# Patient Record
Sex: Female | Born: 1990 | Race: Black or African American | Hispanic: No | Marital: Single | Smoking: Never smoker
Health system: Southern US, Community
[De-identification: ages and names within clinical notes are randomized; demographics above are authoritative.]

## PROBLEM LIST (undated history)

## (undated) ENCOUNTER — Emergency Department (HOSPITAL_COMMUNITY): Payer: Medicare Other | Source: Home / Self Care

## (undated) DIAGNOSIS — D571 Sickle-cell disease without crisis: Secondary | ICD-10-CM

## (undated) DIAGNOSIS — D649 Anemia, unspecified: Secondary | ICD-10-CM

## (undated) DIAGNOSIS — D5701 Hb-SS disease with acute chest syndrome: Secondary | ICD-10-CM

## (undated) DIAGNOSIS — Z720 Tobacco use: Secondary | ICD-10-CM

---

## 2016-05-27 ENCOUNTER — Encounter (HOSPITAL_COMMUNITY): Payer: Self-pay | Admitting: Internal Medicine

## 2016-05-27 ENCOUNTER — Inpatient Hospital Stay (HOSPITAL_COMMUNITY)
Admission: EM | Admit: 2016-05-27 | Discharge: 2016-06-01 | DRG: 812 | Disposition: A | Discharge status: Home / Self Care | Payer: Medicare Other | Attending: Internal Medicine | Admitting: Internal Medicine

## 2016-05-27 DIAGNOSIS — D5700 Hb-SS disease with crisis, unspecified: Secondary | ICD-10-CM | POA: Diagnosis present

## 2016-05-27 DIAGNOSIS — D72829 Elevated white blood cell count, unspecified: Secondary | ICD-10-CM | POA: Diagnosis not present

## 2016-05-27 DIAGNOSIS — D638 Anemia in other chronic diseases classified elsewhere: Secondary | ICD-10-CM | POA: Diagnosis present

## 2016-05-27 DIAGNOSIS — R0902 Hypoxemia: Secondary | ICD-10-CM

## 2016-05-27 DIAGNOSIS — Z79899 Other long term (current) drug therapy: Secondary | ICD-10-CM

## 2016-05-27 DIAGNOSIS — Z590 Homelessness: Secondary | ICD-10-CM

## 2016-05-27 DIAGNOSIS — R0600 Dyspnea, unspecified: Secondary | ICD-10-CM | POA: Diagnosis not present

## 2016-05-27 DIAGNOSIS — D571 Sickle-cell disease without crisis: Secondary | ICD-10-CM | POA: Diagnosis present

## 2016-05-27 HISTORY — DX: Sickle-cell disease without crisis: D57.1

## 2016-05-27 HISTORY — DX: Anemia, unspecified: D64.9

## 2016-05-27 LAB — CBC WITH DIFFERENTIAL/PLATELET
Basophils Absolute: 0.1 10*3/uL (ref 0.0–0.1)
Basophils Relative: 1 %
EOS PCT: 6 %
Eosinophils Absolute: 0.7 10*3/uL (ref 0.0–0.7)
HEMATOCRIT: 25.6 % — AB (ref 36.0–46.0)
Hemoglobin: 8.6 g/dL — ABNORMAL LOW (ref 12.0–15.0)
LYMPHS PCT: 36 %
Lymphs Abs: 4.1 10*3/uL — ABNORMAL HIGH (ref 0.7–4.0)
MCH: 27.8 pg (ref 26.0–34.0)
MCHC: 33.6 g/dL (ref 30.0–36.0)
MCV: 82.8 fL (ref 78.0–100.0)
MONO ABS: 1 10*3/uL (ref 0.1–1.0)
Monocytes Relative: 9 %
Neutro Abs: 5.6 10*3/uL (ref 1.7–7.7)
Neutrophils Relative %: 48 %
PLATELETS: 436 10*3/uL — AB (ref 150–400)
RBC: 3.09 MIL/uL — ABNORMAL LOW (ref 3.87–5.11)
RDW: 19.5 % — AB (ref 11.5–15.5)
WBC: 11.4 10*3/uL — AB (ref 4.0–10.5)

## 2016-05-27 LAB — COMPREHENSIVE METABOLIC PANEL
ALT: 19 U/L (ref 14–54)
AST: 38 U/L (ref 15–41)
Albumin: 4.9 g/dL (ref 3.5–5.0)
Alkaline Phosphatase: 41 U/L (ref 38–126)
Anion gap: 9 (ref 5–15)
BILIRUBIN TOTAL: 2.7 mg/dL — AB (ref 0.3–1.2)
BUN: 9 mg/dL (ref 6–20)
CALCIUM: 9.1 mg/dL (ref 8.9–10.3)
CO2: 20 mmol/L — ABNORMAL LOW (ref 22–32)
Chloride: 109 mmol/L (ref 101–111)
Creatinine, Ser: 0.52 mg/dL (ref 0.44–1.00)
GFR calc Af Amer: >60 mL/min (ref >60)
GFR calc non Af Amer: >60 mL/min (ref >60)
Glucose, Bld: 99 mg/dL (ref 65–99)
POTASSIUM: 3.7 mmol/L (ref 3.5–5.1)
Sodium: 138 mmol/L (ref 135–145)
TOTAL PROTEIN: 8 g/dL (ref 6.5–8.1)

## 2016-05-27 LAB — RETICULOCYTES
RBC.: 3.09 MIL/uL — ABNORMAL LOW (ref 3.87–5.11)
RETIC COUNT ABSOLUTE: 438.8 10*3/uL — AB (ref 19.0–186.0)
Retic Ct Pct: 14.2 % — ABNORMAL HIGH (ref 0.4–3.1)

## 2016-05-27 MED ORDER — HYDROMORPHONE HCL 1 MG/ML IJ SOLN
0.5000 mg | INTRAMUSCULAR | Status: AC
Start: 2016-05-27 — End: 2016-05-27

## 2016-05-27 MED ORDER — ONDANSETRON HCL 4 MG/2ML IJ SOLN
4.0000 mg | Freq: Once | INTRAMUSCULAR | Status: AC
  Administered 2016-05-27: 4 mg via INTRAVENOUS
  Filled 2016-05-27: qty 2

## 2016-05-27 MED ORDER — HYDROMORPHONE HCL 1 MG/ML IJ SOLN: 0.5000 mg | INTRAMUSCULAR | Status: AC

## 2016-05-27 MED ORDER — HYDROMORPHONE HCL 1 MG/ML IJ SOLN
0.5000 mg | INTRAMUSCULAR | Status: AC
  Administered 2016-05-27: 1 mg via INTRAVENOUS
  Filled 2016-05-27: qty 1

## 2016-05-27 MED ORDER — DIPHENHYDRAMINE HCL 50 MG/ML IJ SOLN
25.0000 mg | Freq: Once | INTRAMUSCULAR | Status: AC
  Administered 2016-05-27: 25 mg via INTRAVENOUS
  Filled 2016-05-27: qty 1

## 2016-05-27 MED ORDER — HYDROMORPHONE HCL 1 MG/ML IJ SOLN
0.5000 mg | INTRAMUSCULAR | Status: AC
Start: 2016-05-27 — End: 2016-05-27
  Administered 2016-05-27: 1 mg via INTRAVENOUS
  Filled 2016-05-27: qty 1

## 2016-05-27 MED ORDER — KETOROLAC TROMETHAMINE 15 MG/ML IJ SOLN
15.0000 mg | INTRAMUSCULAR | Status: AC
  Administered 2016-05-27: 15 mg via INTRAVENOUS
  Filled 2016-05-27: qty 1

## 2016-05-27 MED ORDER — SODIUM CHLORIDE 0.45 % IV SOLN
INTRAVENOUS | Status: DC
  Administered 2016-05-27: 22:00:00 via INTRAVENOUS

## 2016-05-27 NOTE — ED Triage Notes (Signed)
Per EMS-complaining of sickle cell pain-while waiting for registration patient was smiling and conversing with BF, appeared to become suddenly weak-stated their ride was taking to long to come and get them so they called EMS-complaining of B/L leg pain-recently moved from [place] to "start a new life with BF"-does not appear to be in any distress-BP 104/67, HR 99, SpO2 99% on RA-ambulatory at scene and walked into ED

## 2016-05-27 NOTE — H&P (Addendum)
History and Physical    Wendy [name] WUJ:[id number] DOB: [birth year] DOA: 05/27/2016  Referring MD/NP/PA:   PCP: No PCP Per Patient   Patient coming from:  The patient is coming from home.  At baseline, pt is independent for most of ADL.   Chief Complaint: Pain all over  HPI: Wendy [name] is a 26 y.o. female with medical history significant of sickle cell disease, who presents with pain all over.  Patient states that her pain started since last night. It involves all over her body, but worst in bilateral upper legs. The leg pain is constant, 10 out of 10 in severity, nonradiating. Patient states that she has nausea, vomited once after she received Dilaudid in ED. Currently no nausea, vomiting, diarrhea or abdominal pain. Denies fever, chills, chest pain, SOB, cough, symptoms of UTI or unilateral weakness.  Patient states that she recently moved to this area for new life with her boyfriend. When asked her where she usually got her care, she said in [place] to [facility], but told me that she was treated in [place], then said [place]. When asked the name of the hospital there, she did not know. She states that she usually takes Percocet, but ran out of her pain medication last week. She reported to EDP that her last crisis was "a long time ago", but told me that her last pain crisis was last week.   ED Course: pt was found to have hemoglobin 8.6 (not know baseline hemoglobin), WBC 11.6, electrolytes renal function okay, temperature normal, tachycardia, section 93% on room air. Patient is placed on tele bed.  Review of Systems:   General: no fevers, chills, no changes in body weight, has fatigue HEENT: no blurry vision, hearing changes or sore throat Respiratory: no dyspnea, coughing, wheezing CV: no chest pain, no palpitations GI: no nausea, vomiting, abdominal pain, diarrhea, constipation GU: no dysuria, burning on urination, increased urinary frequency, hematuria  Ext: no leg  edema Neuro: no unilateral weakness, numbness, or tingling, no vision change or hearing loss Skin: no rash, no skin tear. MSK: Pain all over, particularly in the bilateral upper leg.  Heme: No easy bruising.  Travel history: No recent long distant travel.  Allergy: No Known Allergies  Past Medical History:  Diagnosis Date  . Sickle cell anemia (HCC)     Past Surgical History:  Procedure Laterality Date  . CESAREAN SECTION      Social History:  reports that she has never smoked. She does not have any smokeless tobacco history on file. She reports that she does not drink alcohol or use drugs.  Family History:  Family History  Problem Relation Age of Onset  . Sickle cell trait Father      Prior to Admission medications   Medication Sig Start Date End Date Taking? Authorizing Provider  acetaminophen (TYLENOL) 500 MG tablet Take 500 mg by mouth every 6 (six) hours as needed for moderate pain.   Yes Historical Provider, MD    Physical Exam: Vitals:   05/27/16 2255 05/28/16 0039 05/28/16 0056 05/28/16 0127  BP: 135/71 (!) 119/52    Pulse: (!) 113 92    Resp: 12 16 16 16   Temp: 97.8 F (36.6 C) 97.6 F (36.4 C)    TempSrc: Oral Oral    SpO2: 93% 90% 94% 99%  Weight:      Height:       General: Not in acute distress HEENT:       Eyes: PERRL, EOMI, no scleral  icterus.       ENT: No discharge from the ears and nose, no pharynx injection, no tonsillar enlargement.        Neck: No JVD, no bruit, no mass felt. Heme: No neck lymph node enlargement. Cardiac: S1/S2, RRR, No murmurs, No gallops or rubs. Respiratory:  No rales, wheezing, rhonchi or rubs. GI: Soft, nondistended, nontender, no rebound pain, no organomegaly, BS present. GU: No hematuria Ext: No pitting leg edema bilaterally. 2+DP/PT pulse bilaterally. Musculoskeletal: Has tenderness in both legs.  Skin: No rashes.  Neuro: Alert, oriented X3, cranial nerves II-XII grossly intact, moves all extremities normally.    Psych: Patient is not psychotic, no suicidal or hemocidal ideation.  Labs on Admission: I have personally reviewed following labs and imaging studies  CBC:  Recent Labs Lab 05/27/16 1948  WBC 11.4*  NEUTROABS 5.6  HGB 8.6*  HCT 25.6*  MCV 82.8  PLT 073*   Basic Metabolic Panel:  Recent Labs Lab 05/27/16 1948  NA 138  K 3.7  CL 109  CO2 20*  GLUCOSE 99  BUN 9  CREATININE 0.52  CALCIUM 9.1   GFR: Estimated Creatinine Clearance: 88.2 mL/min (by C-G formula based on SCr of 0.52 mg/dL). Liver Function Tests:  Recent Labs Lab 05/27/16 1948  AST 38  ALT 19  ALKPHOS 41  BILITOT 2.7*  PROT 8.0  ALBUMIN 4.9   No results for input(s): LIPASE, AMYLASE in the last 168 hours. No results for input(s): AMMONIA in the last 168 hours. Coagulation Profile: No results for input(s): INR, PROTIME in the last 168 hours. Cardiac Enzymes: No results for input(s): CKTOTAL, CKMB, CKMBINDEX, TROPONINI in the last 168 hours. BNP (last 3 results) No results for input(s): PROBNP in the last 8760 hours. HbA1C: No results for input(s): HGBA1C in the last 72 hours. CBG: No results for input(s): GLUCAP in the last 168 hours. Lipid Profile: No results for input(s): CHOL, HDL, LDLCALC, TRIG, CHOLHDL, LDLDIRECT in the last 72 hours. Thyroid Function Tests: No results for input(s): TSH, T4TOTAL, FREET4, T3FREE, THYROIDAB in the last 72 hours. Anemia Panel:  Recent Labs  05/27/16 1948  RETICCTPCT 14.2*   Urine analysis: No results found for: COLORURINE, APPEARANCEUR, LABSPEC, PHURINE, GLUCOSEU, HGBUR, BILIRUBINUR, KETONESUR, PROTEINUR, UROBILINOGEN, NITRITE, LEUKOCYTESUR Sepsis Labs: @LABRCNTIP (procalcitonin:4,lacticidven:4) )No results found for this or any previous visit (from the past 240 hour(s)).   Radiological Exams on Admission: No results found.   EKG: Not done in ED, will get one. Sinus rhythm, QTC 440, early R-wave progression, nonspecific T-wave  change.  Assessment/Plan Principal Problem:   Sickle cell pain crisis (HCC) Active Problems:   Sickle cell anemia (HCC)   Leukocytosis   Sickle cell pain crisis and Sickle cell anemia:  Hgb 8.0, not sure about her baseline hemoglobin, but anyway, does not need blood transfusion now. No signs of infection. No chest pain or shortness of breath, less likely to have acute chestsyndrome.  -Place on tele bed given tachycardia. -IV ketorolac -Start folic acid -Benadryl for itch -IVF: D5-1/2NS at 150 cc/h -Zofran for nausea -Start high dose PCA protocol for pain: loading dose 1 mg, bolus dose 0.5 mg, lockout interval 10 min and one hour dose limit at 2 mg.   Leukocytosis: no signs of infection. Likely due to stress induced to demargination. -follow up by CBC -check UA.   DVT ppx: SQ Lovenox Code Status: Full code Family Communication: Yes, patient's boyfriend at bed side Disposition Plan:  Anticipate discharge back to previous home environment  Consults called:  none Admission status: Obs / tele     Date of Service 05/28/2016    Ivor Costa [facility] Pager [phone]  If 7PM-7AM, please contact night-coverage [email or web address] Password [email or web address] 05/28/2016, 2:13 AM

## 2016-05-27 NOTE — ED Provider Notes (Signed)
[facility] DEPT Provider Note   CSN: [id number] Arrival date & time: 05/27/16  1801     History   Chief Complaint Chief Complaint  Patient presents with  . Generalized Body Aches  . Sickle Cell Pain Crisis    HPI Wendy [name] is a 26 y.o. female.  HPI   Patient is a 26 year old female presenting with sickle cell crisis pain. Patient has odd affect. Patient reports she's [place]. When asked her where she usually got her care she said in [place]. Asked which hospital she said [facility] Then she said no, it was [facility] that she goes to. When asked the name of the hospital there, she did not know. She says that she's not on any medications. She says that her last crisis was "a long time ago". She denies chest pain. She reports that she moved here yesterday.  No past medical history on file.  There are no active problems to display for this patient.   No past surgical history on file.  OB History    No data available       Home Medications    Prior to Admission medications   Medication Sig Start Date End Date Taking? Authorizing Provider  acetaminophen (TYLENOL) 500 MG tablet Take 500 mg by mouth every 6 (six) hours as needed for moderate pain.   Yes Historical Provider, MD    Family History No family history on file.  Social History Social History  Substance Use Topics  . Smoking status: Not on file  . Smokeless tobacco: Not on file  . Alcohol use Not on file     Allergies   Patient has no known allergies.   Review of Systems Review of Systems  Constitutional: Negative for activity change.  Respiratory: Negative for shortness of breath.   Cardiovascular: Negative for chest pain.  Gastrointestinal: Negative for abdominal pain.     Physical Exam Updated Vital Signs BP 102/62 (BP Location: Left Arm)   Pulse 77   Temp 98.2 F (36.8 C) (Oral)   Resp 12   Ht 5\' 3"  (1.6 m)   Wt 132 lb 8 oz (60.1 kg)   SpO2 97%   BMI 23.47 kg/m    Physical Exam  Constitutional: She is oriented to person, place, and time. She appears well-developed and well-nourished.  HENT:  Head: Normocephalic and atraumatic.  Eyes: Right eye exhibits no discharge.  Cardiovascular: Normal rate, regular rhythm and normal heart sounds.   No murmur heard. Pulmonary/Chest: Effort normal and breath sounds normal. She has no wheezes. She has no rales.  Abdominal: Soft. She exhibits no distension. There is no tenderness.  Neurological: She is oriented to person, place, and time.  Skin: Skin is warm and dry. She is not diaphoretic.  Psychiatric: She has a normal mood and affect.  Nursing note and vitals reviewed.    ED Treatments / Results  Labs (all labs ordered are listed, but only abnormal results are displayed) Labs Reviewed  COMPREHENSIVE METABOLIC PANEL - Abnormal; Notable for the following:       Result Value   CO2 20 (*)    Total Bilirubin 2.7 (*)    All other components within normal limits  CBC WITH DIFFERENTIAL/PLATELET - Abnormal; Notable for the following:    WBC 11.4 (*)    RBC 3.09 (*)    Hemoglobin 8.6 (*)    HCT 25.6 (*)    RDW 19.5 (*)    Platelets 436 (*)    Lymphs Abs  4.1 (*)    All other components within normal limits  RETICULOCYTES - Abnormal; Notable for the following:    Retic Ct Pct 14.2 (*)    RBC. 3.09 (*)    Retic Count, Manual 438.8 (*)    All other components within normal limits    EKG  EKG Interpretation None       Radiology No results found.  Procedures Procedures (including critical care time)  Medications Ordered in ED Medications  0.45 % sodium chloride infusion (not administered)  ketorolac (TORADOL) 15 MG/ML injection 15 mg (not administered)  HYDROmorphone (DILAUDID) injection 0.5-1 mg (not administered)    Or  HYDROmorphone (DILAUDID) injection 0.5-1 mg (not administered)  HYDROmorphone (DILAUDID) injection 0.5-1 mg (not administered)    Or  HYDROmorphone (DILAUDID) injection  0.5-1 mg (not administered)  HYDROmorphone (DILAUDID) injection 0.5-1 mg (not administered)    Or  HYDROmorphone (DILAUDID) injection 0.5-1 mg (not administered)     Initial Impression / Assessment and Plan / ED Course  I have reviewed the triage vital signs and the nursing notes.  Pertinent labs & imaging results that were available during my care of the patient were reviewed by me and considered in my medical decision making (see chart for details).     Very well-appearing 26 year old female presenting with sickle cell crisis. Patient is very odd affect. Is difficult to tell what is going on. She reports that she has sickle cell [place]. Patient does have a higher reti count.Burnis Medin treat her like a sickle cell crisis however it is very common to have a sickle cell crisis and [place]. There are no records available on care everyone.  11:36 PM We've given 3 pain medication doses. Patient still in pain, vomiting. Will admit to hospitalist for pain crisis.  Final Clinical Impressions(s) / ED Diagnoses   Final diagnoses:  None    New Prescriptions New Prescriptions   No medications on file     Rozalynn Buege Julio Alm, MD 05/27/16 2336

## 2016-05-28 ENCOUNTER — Encounter (HOSPITAL_COMMUNITY): Payer: Self-pay | Admitting: Internal Medicine

## 2016-05-28 DIAGNOSIS — Z79899 Other long term (current) drug therapy: Secondary | ICD-10-CM | POA: Diagnosis not present

## 2016-05-28 DIAGNOSIS — D72829 Elevated white blood cell count, unspecified: Secondary | ICD-10-CM | POA: Diagnosis present

## 2016-05-28 DIAGNOSIS — D638 Anemia in other chronic diseases classified elsewhere: Secondary | ICD-10-CM

## 2016-05-28 DIAGNOSIS — D5700 Hb-SS disease with crisis, unspecified: Principal | ICD-10-CM

## 2016-05-28 DIAGNOSIS — R0902 Hypoxemia: Secondary | ICD-10-CM | POA: Diagnosis present

## 2016-05-28 DIAGNOSIS — R0600 Dyspnea, unspecified: Secondary | ICD-10-CM | POA: Diagnosis not present

## 2016-05-28 DIAGNOSIS — R0609 Other forms of dyspnea: Secondary | ICD-10-CM | POA: Diagnosis not present

## 2016-05-28 DIAGNOSIS — D5701 Hb-SS disease with acute chest syndrome: Secondary | ICD-10-CM | POA: Diagnosis not present

## 2016-05-28 DIAGNOSIS — Z590 Homelessness: Secondary | ICD-10-CM | POA: Diagnosis not present

## 2016-05-28 LAB — URINALYSIS, ROUTINE W REFLEX MICROSCOPIC
Bilirubin Urine: NEGATIVE
GLUCOSE, UA: NEGATIVE mg/dL
HGB URINE DIPSTICK: NEGATIVE
Ketones, ur: NEGATIVE mg/dL
NITRITE: NEGATIVE
Protein, ur: 30 mg/dL — AB
SPECIFIC GRAVITY, URINE: 1.011 (ref 1.005–1.030)
pH: 6 (ref 5.0–8.0)

## 2016-05-28 LAB — HIV ANTIBODY (ROUTINE TESTING W REFLEX): HIV Screen 4th Generation wRfx: NONREACTIVE

## 2016-05-28 LAB — HCG, QUANTITATIVE, PREGNANCY: hCG, Beta Chain, Quant, S: <1 m[IU]/mL (ref <5)

## 2016-05-28 MED ORDER — DEXTROSE-NACL 5-0.45 % IV SOLN
INTRAVENOUS | Status: DC
  Administered 2016-05-28 – 2016-05-29 (×3): via INTRAVENOUS

## 2016-05-28 MED ORDER — POLYETHYLENE GLYCOL 3350 17 G PO PACK: 17.0000 g | PACK | Freq: Every day | ORAL | Status: DC | PRN

## 2016-05-28 MED ORDER — NALOXONE HCL 0.4 MG/ML IJ SOLN: 0.4000 mg | INTRAMUSCULAR | Status: DC | PRN

## 2016-05-28 MED ORDER — SODIUM CHLORIDE 0.9% FLUSH: 9.0000 mL | INTRAVENOUS | Status: DC | PRN

## 2016-05-28 MED ORDER — KETOROLAC TROMETHAMINE 15 MG/ML IJ SOLN
15.0000 mg | Freq: Four times a day (QID) | INTRAMUSCULAR | Status: DC
  Administered 2016-05-28 – 2016-06-01 (×19): 15 mg via INTRAVENOUS
  Filled 2016-05-28 (×19): qty 1

## 2016-05-28 MED ORDER — FOLIC ACID 1 MG PO TABS
1.0000 mg | ORAL_TABLET | Freq: Every day | ORAL | Status: DC
  Administered 2016-05-28 – 2016-06-01 (×5): 1 mg via ORAL
  Filled 2016-05-28 (×5): qty 1

## 2016-05-28 MED ORDER — HYDROMORPHONE 1 MG/ML IV SOLN
INTRAVENOUS | Status: DC
  Administered 2016-05-29: 2.5 mg via INTRAVENOUS
  Administered 2016-05-29: 14:00:00 via INTRAVENOUS
  Administered 2016-05-29 – 2016-05-30 (×3): 2 mg via INTRAVENOUS
  Administered 2016-05-30: 4 mg via INTRAVENOUS
  Administered 2016-05-30: 3.9 mg via INTRAVENOUS
  Administered 2016-05-31: 2.5 mg via INTRAVENOUS
  Administered 2016-05-31: 1 mg via INTRAVENOUS
  Administered 2016-05-31: 0.05 mg via INTRAVENOUS
  Administered 2016-05-31 (×2): 1 mg via INTRAVENOUS
  Administered 2016-05-31: 21:00:00 via INTRAVENOUS
  Administered 2016-05-31: 0.5 mg via INTRAVENOUS
  Administered 2016-06-01: 1 mg via INTRAVENOUS
  Administered 2016-06-01: 1.5 mg via INTRAVENOUS
  Administered 2016-06-01: 0.5 mg via INTRAVENOUS
  Administered 2016-06-01: 2 mg via INTRAVENOUS
  Filled 2016-05-28 (×2): qty 25

## 2016-05-28 MED ORDER — HYDROMORPHONE HCL 4 MG/ML IJ SOLN
1.0000 mg | Freq: Once | INTRAMUSCULAR | Status: AC
  Administered 2016-05-28: 1 mg via INTRAVENOUS

## 2016-05-28 MED ORDER — ONDANSETRON HCL 4 MG/2ML IJ SOLN
4.0000 mg | Freq: Four times a day (QID) | INTRAMUSCULAR | Status: DC | PRN
  Administered 2016-05-29 – 2016-05-31 (×2): 4 mg via INTRAVENOUS
  Filled 2016-05-28 (×2): qty 2

## 2016-05-28 MED ORDER — SENNOSIDES-DOCUSATE SODIUM 8.6-50 MG PO TABS
1.0000 | ORAL_TABLET | Freq: Two times a day (BID) | ORAL | Status: DC
  Administered 2016-05-28 – 2016-06-01 (×8): 1 via ORAL
  Filled 2016-05-28 (×10): qty 1

## 2016-05-28 MED ORDER — ACETAMINOPHEN 325 MG PO TABS: 650.0000 mg | ORAL_TABLET | Freq: Four times a day (QID) | ORAL | Status: DC | PRN

## 2016-05-28 MED ORDER — DIPHENHYDRAMINE HCL 12.5 MG/5ML PO ELIX
12.5000 mg | ORAL_SOLUTION | Freq: Four times a day (QID) | ORAL | Status: DC | PRN
  Administered 2016-05-28: 12.5 mg via ORAL
  Filled 2016-05-28: qty 5

## 2016-05-28 MED ORDER — ENOXAPARIN SODIUM 40 MG/0.4ML ~~LOC~~ SOLN
40.0000 mg | Freq: Every day | SUBCUTANEOUS | Status: DC
  Filled 2016-05-28 (×2): qty 0.4

## 2016-05-28 MED ORDER — ZOLPIDEM TARTRATE 5 MG PO TABS: 5.0000 mg | ORAL_TABLET | Freq: Every evening | ORAL | Status: DC | PRN

## 2016-05-28 MED ORDER — DIPHENHYDRAMINE HCL 50 MG/ML IJ SOLN
12.5000 mg | Freq: Four times a day (QID) | INTRAMUSCULAR | Status: DC | PRN
  Administered 2016-05-28 (×2): 12.5 mg via INTRAVENOUS
  Filled 2016-05-28 (×2): qty 1

## 2016-05-28 MED ORDER — HYDROMORPHONE 1 MG/ML IV SOLN
INTRAVENOUS | Status: DC
  Administered 2016-05-28 (×2): 1.5 mg via INTRAVENOUS
  Administered 2016-05-28: 01:00:00 via INTRAVENOUS
  Administered 2016-05-28: 1 mg via INTRAVENOUS
  Administered 2016-05-28: 2.5 mg via INTRAVENOUS
  Filled 2016-05-28: qty 25
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
## 2016-05-28 NOTE — Care Management Note (Signed)
Case Management Note  Patient Details  Name: Lillymae Duet MRN: [id number] Date of Birth: 1990/04/15  Subjective/Objective:       Sickle cell crisis             Action/Plan: Date:  May 28, 2016 Chart reviewed for concurrent status and case management needs. Will continue to follow patient progress. Discharge Planning: following for needs Expected discharge date: [id number] Velva Harman, BSN, [place], [place]  Expected Discharge Date:                  Expected Discharge Plan:  Home/Self Care  In-House Referral:     Discharge planning Services     Post Acute Care Choice:    Choice offered to:     DME Arranged:    DME Agency:     HH Arranged:    [place] Agency:     Status of Service:  In process, will continue to follow  If discussed at Long Length of Stay Meetings, dates discussed:    Additional Comments:  Leeroy Cha, RN 05/28/2016, 10:04 AM

## 2016-05-28 NOTE — Progress Notes (Signed)
Medical records faxed to facilities requested by MD. Awaiting fax back with records. Julez Huseby W Geza Beranek, RN

## 2016-05-29 LAB — HEMOGLOBINOPATHY EVALUATION
HGB A2 QUANT: 3.5 % — AB (ref 1.8–3.2)
HGB C: 0 %
HGB F QUANT: 4.5 % — AB (ref 0.0–2.0)
Hgb A: 0 % — ABNORMAL LOW (ref 96.4–98.8)
Hgb S Quant: 92 % — ABNORMAL HIGH
Hgb Variant: 0 %

## 2016-05-29 MED ORDER — OXYCODONE HCL 5 MG PO TABS
5.0000 mg | ORAL_TABLET | ORAL | Status: DC
  Administered 2016-05-29 – 2016-06-01 (×18): 5 mg via ORAL
  Filled 2016-05-29 (×19): qty 1
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
## 2016-05-29 NOTE — Progress Notes (Addendum)
Patient requested to talk with CSW about social situation. Patient boyfriend at bedside. Patient explain she came to [place], [place] from [place] to help friends move into their home. She report they were living with the couple temporarily. Since being in the hospital their clothes and other belongings have been destroyed. The patient and BF state they are trying to return home to [place], [place]. They have a child there, with family members. The patient states they have no funds or transportation to get back home.  CSW discussed with Surveyor, quantity of [place]. He states we are able to provide transportation-one way [employer] for patient to get to [place], but patient BF will have to find a way to transport. CSW provided patient BF with information about the [facility] that maybe able to assist with funds for transportation.  CSW will continue to follow and assist with patient needs.   4:57-Discussed with Dr. Zigmund Daniel, [facility] will provide ticket for Boyfriend once patient is ready for dc.  Patient and BF informed.   Kathrin Greathouse, Latanya Presser, MSW Clinical Social Worker 5E and Psychiatric Service Line [phone] 05/29/2016  4:27 PM

## 2016-05-30 ENCOUNTER — Inpatient Hospital Stay (HOSPITAL_COMMUNITY): Payer: Medicare Other

## 2016-05-30 DIAGNOSIS — R0902 Hypoxemia: Secondary | ICD-10-CM

## 2016-05-30 DIAGNOSIS — R0609 Other forms of dyspnea: Secondary | ICD-10-CM

## 2016-05-30 DIAGNOSIS — D5701 Hb-SS disease with acute chest syndrome: Secondary | ICD-10-CM

## 2016-05-30 LAB — ABO/RH: ABO/RH(D): A POS

## 2016-05-30 LAB — CBC WITH DIFFERENTIAL/PLATELET
BASOS ABS: 0 10*3/uL (ref 0.0–0.1)
Basophils Relative: 0 %
Eosinophils Absolute: 1 10*3/uL — ABNORMAL HIGH (ref 0.0–0.7)
Eosinophils Relative: 7 %
HCT: 17.2 % — ABNORMAL LOW (ref 36.0–46.0)
HEMOGLOBIN: 6.2 g/dL — AB (ref 12.0–15.0)
LYMPHS PCT: 20 %
Lymphs Abs: 3 10*3/uL (ref 0.7–4.0)
MCH: 29.5 pg (ref 26.0–34.0)
MCHC: 36 g/dL (ref 30.0–36.0)
MCV: 81.9 fL (ref 78.0–100.0)
MONO ABS: 1.2 10*3/uL — AB (ref 0.1–1.0)
MONOS PCT: 8 %
NEUTROS ABS: 9.6 10*3/uL — AB (ref 1.7–7.7)
NEUTROS PCT: 65 %
Platelets: 350 10*3/uL (ref 150–400)
RBC: 2.1 MIL/uL — AB (ref 3.87–5.11)
RDW: 25.3 % — AB (ref 11.5–15.5)
WBC: 14.8 10*3/uL — AB (ref 4.0–10.5)

## 2016-05-30 LAB — RETICULOCYTES
RBC.: 2.1 MIL/uL — ABNORMAL LOW (ref 3.87–5.11)
RETIC CT PCT: 18.7 % — AB (ref 0.4–3.1)
Retic Count, Absolute: 392.7 10*3/uL — ABNORMAL HIGH (ref 19.0–186.0)

## 2016-05-30 LAB — LACTATE DEHYDROGENASE: LDH: 254 U/L — ABNORMAL HIGH (ref 98–192)

## 2016-05-30 LAB — PREPARE RBC (CROSSMATCH)

## 2016-05-30 MED ORDER — SODIUM CHLORIDE 0.9 % IV SOLN: Freq: Once | INTRAVENOUS | Status: AC

## 2016-05-30 NOTE — Progress Notes (Signed)
SICKLE CELL SERVICE PROGRESS NOTE  Wendy [name] WVP:[id number] DOB: [birth year] DOA: 05/27/2016 PCP: No PCP Per Patient  Assessment/Plan: Principal Problem:   Sickle cell pain crisis ([place]) Active Problems:   Sickle cell anemia (HCC)   Leukocytosis  1. Acute Chest Syndrome: Pt is hypoxic with new infiltrates and no clinical sequela of infection. Will ttreat as Acute Chest Syndrome and transfuse 1 unit RBC. Will hold on starting any antibiotics.  2. Hypoxia with DOE: Pt at risk for PAH. Will obtain 2-D ECHO. Continue Oxygen supplementation.  3. Hb SS with Crisis: Continue Oxycodone 5 mg and PCA for PRN use. Continue Toradol. IVF at [facility]  4. Anemia of Chronic Disease: Baseline Hb unknown but with Acute Chest syndrome and high burden of Hb S %, transfusion of RBC's indicated.   5. Sickle Cell Disease: Hb electrophoresis shows Hb SS with Hb S 92%, Hb F 4.5 % and Hb A2 3.5%.  Pt needs to be on Hydrea and probably Endari. She currently does not have a Immunologist. However she is registered withe the CBO in [place] and they will make a referral to [place] who can in-turn refer to [place].  6. Psychosocial: Pt spoke with Rexene Agent at [facility] in [place] and will connect for referrals when she returns to [place].    Code Status: Full Code Family Communication: N/A Disposition Plan: Not yet ready for discharge  [employer]  Pager [phone]. If 7PM-7AM, please contact night-coverage.  05/30/2016, 1:37 PM  LOS: 2 days   Interim History: Pt reports pain improved. She reports that she has been having DOE prior to admission for several months.    Consultants:  None  Procedures:  None  Antibiotics:  None   Objective: Vitals:   05/30/16 0400 05/30/16 0459 05/30/16 0759 05/30/16 1120  BP:  (!) 119/58    Pulse:  (!) 113    Resp: 18 14 16 11   Temp:  100 F (37.8 C)    TempSrc:  Oral    SpO2: 93% 90%  93%  Weight:      Height:        Weight change:   Intake/Output Summary (Last 24 hours) at 05/30/16 1337 Last data filed at 05/30/16 0500  Gross per 24 hour  Intake             4140 ml  Output                0 ml  Net             4140 ml    General: Alert, awake, oriented x3, in no acute distress.  HEENT: [place]/AT PEERL, EOMI, mild icterus Neck: Trachea midline,  no masses, no thyromegal,y no JVD, no carotid bruit OROPHARYNX:  Moist, No exudate/ erythema/lesions.  Heart: Regular rate and rhythm, without murmurs, rubs, gallops, PMI non-displaced, no heaves or thrills on palpation.  Lungs: Clear to auscultation, no wheezing or rhonchi noted. No increased vocal fremitus resonant to percussion  Abdomen: Soft, nontender, nondistended, positive bowel sounds, no masses no hepatosplenomegaly noted.  Neuro: No focal neurological deficits noted cranial nerves II through XII grossly intact.  Strength at baseline in bilateral upper and lower extremities. Musculoskeletal: No warmth swelling or erythema around joints, no spinal tenderness noted. Psychiatric: Patient alert and oriented x3, good insight and cognition, good recent to remote recall.    Data Reviewed: Basic Metabolic Panel:  Recent Labs Lab 05/27/16 1948  NA 138  K 3.7  CL 109  CO2 20*  GLUCOSE 99  BUN 9  CREATININE 0.52  CALCIUM 9.1   Liver Function Tests:  Recent Labs Lab 05/27/16 1948  AST 38  ALT 19  ALKPHOS 41  BILITOT 2.7*  PROT 8.0  ALBUMIN 4.9   No results for input(s): LIPASE, AMYLASE in the last 168 hours. No results for input(s): AMMONIA in the last 168 hours. CBC:  Recent Labs Lab 05/27/16 1948 05/30/16 0610  WBC 11.4* 14.8*  NEUTROABS 5.6 9.6*  HGB 8.6* 6.2*  HCT 25.6* 17.2*  MCV 82.8 81.9  PLT 436* 350   Cardiac Enzymes: No results for input(s): CKTOTAL, CKMB, CKMBINDEX, TROPONINI in the last 168 hours. BNP (last 3 results) No results for input(s): BNP in the last 8760 hours.  ProBNP (last 3 results) No results for  input(s): PROBNP in the last 8760 hours.  CBG: No results for input(s): GLUCAP in the last 168 hours.  No results found for this or any previous visit (from the past 240 hour(s)).   Studies: Dg Chest 2 View  Result Date: 05/30/2016 CLINICAL DATA:  Shortness of breath today EXAM: CHEST  2 VIEW COMPARISON:  None. FINDINGS: Heart is borderline in size. Patchy airspace disease throughout the right lung concerning for pneumonia. No effusions. No acute bony abnormality. IMPRESSION: Patchy areas of airspace opacity in the right lung concerning for pneumonia. Electronically Signed   By: Rolm Baptise M.D.   On: 05/30/2016 11:50    Scheduled Meds: . enoxaparin (LOVENOX) injection  40 mg Subcutaneous QHS  . folic acid  1 mg Oral Daily  . HYDROmorphone   Intravenous Q4H  . ketorolac  15 mg Intravenous Q6H  . oxyCODONE  5 mg Oral Q4H  . senna-docusate  1 tablet Oral BID   Continuous Infusions: . dextrose 5 % and 0.45% NaCl 10 mL/hr at 05/30/16 1027    Principal Problem:   Sickle cell pain crisis (HCC) Active Problems:   Sickle cell anemia (HCC)   Leukocytosis    In excess of 35 minutes spent during this visit. Greater than 50% involved face to face contact with the patient for assessment, counseling and coordination of care.

## 2016-05-31 ENCOUNTER — Other Ambulatory Visit (HOSPITAL_COMMUNITY): Payer: Medicaid Other

## 2016-05-31 LAB — RETICULOCYTES
RBC.: 2.59 MIL/uL — AB (ref 3.87–5.11)
RETIC CT PCT: 16.7 % — AB (ref 0.4–3.1)
Retic Count, Absolute: 432.5 10*3/uL — ABNORMAL HIGH (ref 19.0–186.0)

## 2016-05-31 LAB — CBC WITH DIFFERENTIAL/PLATELET
BASOS ABS: 0 10*3/uL (ref 0.0–0.1)
Basophils Relative: 0 %
EOS ABS: 0.9 10*3/uL — AB (ref 0.0–0.7)
Eosinophils Relative: 7 %
HCT: 21.2 % — ABNORMAL LOW (ref 36.0–46.0)
HEMOGLOBIN: 7.5 g/dL — AB (ref 12.0–15.0)
LYMPHS ABS: 2.5 10*3/uL (ref 0.7–4.0)
Lymphocytes Relative: 20 %
MCH: 29 pg (ref 26.0–34.0)
MCHC: 35.4 g/dL (ref 30.0–36.0)
MCV: 81.9 fL (ref 78.0–100.0)
MONOS PCT: 7 %
Monocytes Absolute: 0.9 10*3/uL (ref 0.1–1.0)
Neutro Abs: 8.1 10*3/uL — ABNORMAL HIGH (ref 1.7–7.7)
Neutrophils Relative %: 66 %
PLATELETS: 346 10*3/uL (ref 150–400)
RBC: 2.59 MIL/uL — AB (ref 3.87–5.11)
RDW: 23.6 % — ABNORMAL HIGH (ref 11.5–15.5)
WBC: 12.4 10*3/uL — AB (ref 4.0–10.5)

## 2016-05-31 MED ORDER — METOCLOPRAMIDE HCL 5 MG/ML IJ SOLN
5.0000 mg | Freq: Once | INTRAMUSCULAR | Status: AC
  Administered 2016-05-31: 5 mg via INTRAVENOUS
  Filled 2016-05-31: qty 2

## 2016-05-31 NOTE — Progress Notes (Signed)
SICKLE CELL SERVICE PROGRESS NOTE  Wendy [name] PFX:[id number] DOB: 02-19-1990 DOA: 05/27/2016 PCP: No PCP Per Patient  Assessment/Plan: Principal Problem:   Sickle cell pain crisis ([place]) Active Problems:   Sickle cell anemia (HCC)   Leukocytosis  1. Acute Chest Syndrome: Patient is improving. No significant SOB, No cough, no NVD 2. Hypoxia with DOE: Pt at risk for PAH. 2-D ECHO pending. Continue Oxygen supplementation.  3. Hb SS with Crisis: Continue Oxycodone 5 mg and PCA for PRN use. Continue Toradol. IVF at [facility].  4. Anemia of Chronic Disease: S/P transfusion. Hb stable now. 5. Sickle Cell Disease: Hb electrophoresis shows Hb SS with Hb S 92%, Hb F 4.5 % and Hb A2 3.5%.  Pt needs to be on Hydrea and probably Endari. She currently does not have a Immunologist. However she is registered withe the CBO in [place] and they will make a referral to [place] who can in-turn refer to [place].  6. Psychosocial: Pt spoke with Rexene Agent at [facility] in [place] and will connect for referrals when she returns to [place].    Code Status: Full Code Family Communication: N/A Disposition Plan: Not yet ready for discharge  [facility]  Pager [phone]. If 7PM-7AM, please contact night-coverage.  05/31/2016, 1:45 PM  LOS: 3 days   Interim History: Pt reports pain improved. She reports that she has been having DOE prior to admission for several months. Now feeling better now.   Consultants:  None  Procedures:  None  Antibiotics:  None   Objective: Vitals:   05/31/16 0358 05/31/16 0754 05/31/16 1000 05/31/16 1156  BP:   118/79   Pulse:   97   Resp: 12 14 16 13   Temp:      TempSrc:      SpO2: 97% 97% 100% 94%  Weight:      Height:       Weight change:   Intake/Output Summary (Last 24 hours) at 05/31/16 1345 Last data filed at 05/31/16 1000  Gross per 24 hour  Intake             1238 ml  Output                0 ml  Net             1238 ml     General: Alert, awake, oriented x3, in no acute distress.  HEENT: /AT PEERL, EOMI, mild icterus Neck: Trachea midline,  no masses, no thyromegal,y no JVD, no carotid bruit OROPHARYNX:  Moist, No exudate/ erythema/lesions.  Heart: Regular rate and rhythm, without murmurs, rubs, gallops, PMI non-displaced, no heaves or thrills on palpation.  Lungs: Clear to auscultation, no wheezing or rhonchi noted. No increased vocal fremitus resonant to percussion  Abdomen: Soft, nontender, nondistended, positive bowel sounds, no masses no hepatosplenomegaly noted.  Neuro: No focal neurological deficits noted cranial nerves II through XII grossly intact.  Strength at baseline in bilateral upper and lower extremities. Musculoskeletal: No warmth swelling or erythema around joints, no spinal tenderness noted. Psychiatric: Patient alert and oriented x3, good insight and cognition, good recent to remote recall.    Data Reviewed: Basic Metabolic Panel:  Recent Labs Lab 05/27/16 1948  NA 138  K 3.7  CL 109  CO2 20*  GLUCOSE 99  BUN 9  CREATININE 0.52  CALCIUM 9.1   Liver Function Tests:  Recent Labs Lab 05/27/16 1948  AST 38  ALT 19  ALKPHOS 41  BILITOT 2.7*  PROT 8.0  ALBUMIN 4.9   No results for input(s): LIPASE, AMYLASE in the last 168 hours. No results for input(s): AMMONIA in the last 168 hours. CBC:  Recent Labs Lab 05/27/16 1948 05/30/16 0610 05/31/16 0617  WBC 11.4* 14.8* 12.4*  NEUTROABS 5.6 9.6* 8.1*  HGB 8.6* 6.2* 7.5*  HCT 25.6* 17.2* 21.2*  MCV 82.8 81.9 81.9  PLT 436* 350 346   Cardiac Enzymes: No results for input(s): CKTOTAL, CKMB, CKMBINDEX, TROPONINI in the last 168 hours. BNP (last 3 results) No results for input(s): BNP in the last 8760 hours.  ProBNP (last 3 results) No results for input(s): PROBNP in the last 8760 hours.  CBG: No results for input(s): GLUCAP in the last 168 hours.  No results found for this or any previous visit (from the past  240 hour(s)).   Studies: Dg Chest 2 View  Result Date: 05/30/2016 CLINICAL DATA:  Shortness of breath today EXAM: CHEST  2 VIEW COMPARISON:  None. FINDINGS: Heart is borderline in size. Patchy airspace disease throughout the right lung concerning for pneumonia. No effusions. No acute bony abnormality. IMPRESSION: Patchy areas of airspace opacity in the right lung concerning for pneumonia. Electronically Signed   By: Rolm Baptise M.D.   On: 05/30/2016 11:50    Scheduled Meds: . enoxaparin (LOVENOX) injection  40 mg Subcutaneous QHS  . folic acid  1 mg Oral Daily  . HYDROmorphone   Intravenous Q4H  . ketorolac  15 mg Intravenous Q6H  . oxyCODONE  5 mg Oral Q4H  . senna-docusate  1 tablet Oral BID   Continuous Infusions: . dextrose 5 % and 0.45% NaCl 10 mL/hr at 05/30/16 1027    Principal Problem:   Sickle cell pain crisis (HCC) Active Problems:   Sickle cell anemia (HCC)   Leukocytosis    In excess of 35 minutes spent during this visit. Greater than 50% involved face to face contact with the patient for assessment, counseling and coordination of care.

## 2016-06-01 ENCOUNTER — Other Ambulatory Visit (HOSPITAL_COMMUNITY): Payer: Medicaid Other

## 2016-06-01 LAB — COMPREHENSIVE METABOLIC PANEL
ALBUMIN: 3.8 g/dL (ref 3.5–5.0)
ALK PHOS: 36 U/L — AB (ref 38–126)
ALT: 22 U/L (ref 14–54)
AST: 42 U/L — ABNORMAL HIGH (ref 15–41)
Anion gap: 8 (ref 5–15)
BUN: 7 mg/dL (ref 6–20)
CALCIUM: 8.7 mg/dL — AB (ref 8.9–10.3)
CHLORIDE: 109 mmol/L (ref 101–111)
CO2: 24 mmol/L (ref 22–32)
CREATININE: 0.44 mg/dL (ref 0.44–1.00)
GFR calc non Af Amer: >60 mL/min (ref >60)
GLUCOSE: 79 mg/dL (ref 65–99)
Potassium: 3.4 mmol/L — ABNORMAL LOW (ref 3.5–5.1)
SODIUM: 141 mmol/L (ref 135–145)
Total Bilirubin: 6.2 mg/dL — ABNORMAL HIGH (ref 0.3–1.2)
Total Protein: 6.5 g/dL (ref 6.5–8.1)

## 2016-06-01 LAB — CBC WITH DIFFERENTIAL/PLATELET
BASOS ABS: 0 10*3/uL (ref 0.0–0.1)
Basophils Relative: 0 %
EOS ABS: 0.8 10*3/uL — AB (ref 0.0–0.7)
Eosinophils Relative: 9 %
HCT: 23.9 % — ABNORMAL LOW (ref 36.0–46.0)
HEMOGLOBIN: 8 g/dL — AB (ref 12.0–15.0)
LYMPHS PCT: 24 %
Lymphs Abs: 2.1 10*3/uL (ref 0.7–4.0)
MCH: 28.4 pg (ref 26.0–34.0)
MCHC: 33.5 g/dL (ref 30.0–36.0)
MCV: 84.8 fL (ref 78.0–100.0)
MONOS PCT: 9 %
Monocytes Absolute: 0.8 10*3/uL (ref 0.1–1.0)
NEUTROS ABS: 5 10*3/uL (ref 1.7–7.7)
NEUTROS PCT: 58 %
PLATELETS: 355 10*3/uL (ref 150–400)
RBC: 2.82 MIL/uL — AB (ref 3.87–5.11)
RDW: 24.9 % — ABNORMAL HIGH (ref 11.5–15.5)
WBC: 8.7 10*3/uL (ref 4.0–10.5)

## 2016-06-01 MED ORDER — OXYCODONE HCL 5 MG PO TABS: 5.0000 mg | ORAL_TABLET | ORAL | 0 refills | Status: DC

## 2016-06-01 NOTE — Progress Notes (Signed)
Discussed discharge instructions with patient and boyfriend, they verbalized agreement and understanding.  Patient to go by bus to homeless shelter and f/u with SW  And Dr Rodena Piety to get bus passes back to [employer].  SW provided 4 bus passes to get to homeless shelter and back to get [employer] passes tomorrow.  Patient left with all belongings.

## 2016-06-01 NOTE — Progress Notes (Signed)
CSW received call from patient's nurse, Maudie Mercury that patient is being discharged today & will need bus ticket back to [place], [place]. Luna reviewed notes that [facility] will cover cost of patient, but not patient's boyfriend. CSW called Dr. Jonelle Sidle to see if he had any information as [facility] is closed on the weekends, but he was unaware of situation. CSW confirmed with CSW Surveyor, quantity that dept will only cover cost for patient. Patient states that she is not going to leave without her boyfriend & has opted to go to homeless shelter until the morning when they can get in contact with Clinic for bus ticket. CSW provided patient with weekday CSW, Elmyra Ricks to call for ticket for patient when ready. CSW provided patient with information on [place] list, encouraged her to go ahead & get ready to go as they close at 2:00 on Sundays. CSW also provided patient with [employer] bus passes.   No further CSW needs identified - CSW signing off.   Raynaldo Opitz, [facility] Clinical Social Worker cell #: [phone]

## 2016-06-01 NOTE — Discharge Summary (Signed)
Physician Discharge Summary  Patient ID: Wendy [name] MRN: [id number] DOB/AGE: [birth year] 26 y.o.  Admit date: 05/27/2016 Discharge date: 06/01/2016  Admission Diagnoses:  Discharge Diagnoses:  Principal Problem:   Sickle cell pain crisis ([place]) Active Problems:   Sickle cell anemia (HCC)   Leukocytosis   Discharged Condition: good  Hospital Course: Patient admitted with sickle cell painful crisis and acute chest syndrome. She was treated with IV Dilaudid PCA and Toradol. Also received one unit of packed red blood cells transfusion. She had dyspnea on exertion which cut better. At the time of discharge patient was doing much better. She was transitioned to her oral medication I will follow-up at [facility]. She was otherwise stable at the time of discharge. No antibiotics was used.  Consults: None  Significant Diagnostic Studies: labs: Serial CBCs and CMP is checked. Hemoglobin remained stable at the time of discharge  Treatments: IV hydration and analgesia: acetaminophen and Dilaudid  Discharge Exam: Blood pressure 125/81, pulse 92, temperature 98.8 F (37.1 C), temperature source Oral, resp. rate 15, height 5\' 3"  (1.6 m), weight 60.1 kg (132 lb 8 oz), SpO2 100 %. General appearance: alert, cooperative, appears stated age and no distress Back: symmetric, no curvature. ROM normal. No CVA tenderness. Resp: clear to auscultation bilaterally Cardio: regular rate and rhythm, S1, S2 normal, no murmur, click, rub or gallop GI: soft, non-tender; bowel sounds normal; no masses,  no organomegaly Extremities: extremities normal, atraumatic, no cyanosis or edema Pulses: 2+ and symmetric Skin: Skin color, texture, turgor normal. No rashes or lesions Neurologic: Grossly normal  Disposition: Final discharge disposition not confirmed  Discharge Instructions    Diet - low sodium heart healthy    Complete by:  As directed    Increase activity slowly    Complete by:  As directed       Allergies as of 06/01/2016   No Known Allergies     Medication List    TAKE these medications   acetaminophen 500 MG tablet Commonly known as:  TYLENOL Take 500 mg by mouth every 6 (six) hours as needed for moderate pain.   oxyCODONE 5 MG immediate release tablet Commonly known as:  Oxy IR/ROXICODONE Take 1 tablet (5 mg total) by mouth every 4 (four) hours.        SignedBarbette Merino 06/01/2016, 8:06 AM   Time spent 34 minutes

## 2016-06-02 LAB — TYPE AND SCREEN
ABO/RH(D): A POS
Antibody Screen: NEGATIVE
UNIT DIVISION: 0

## 2016-06-02 LAB — BPAM RBC
BLOOD PRODUCT EXPIRATION DATE: 201805202359
ISSUE DATE / TIME: 201804271354
UNIT TYPE AND RH: 5100

## 2017-04-26 ENCOUNTER — Other Ambulatory Visit: Payer: Self-pay

## 2017-04-26 ENCOUNTER — Emergency Department (HOSPITAL_COMMUNITY)
Admission: EM | Admit: 2017-04-26 | Discharge: 2017-04-26 | Disposition: A | Discharge status: Home / Self Care | Payer: Medicare Other | Attending: Emergency Medicine | Admitting: Emergency Medicine

## 2017-04-26 ENCOUNTER — Encounter (HOSPITAL_COMMUNITY): Payer: Self-pay | Admitting: Emergency Medicine

## 2017-04-26 DIAGNOSIS — D5700 Hb-SS disease with crisis, unspecified: Secondary | ICD-10-CM | POA: Diagnosis not present

## 2017-04-26 DIAGNOSIS — M79669 Pain in unspecified lower leg: Secondary | ICD-10-CM | POA: Diagnosis present

## 2017-04-26 LAB — CBC WITH DIFFERENTIAL/PLATELET
Basophils Absolute: 0 10*3/uL (ref 0.0–0.1)
Basophils Relative: 1 %
Eosinophils Absolute: 0.3 10*3/uL (ref 0.0–0.7)
Eosinophils Relative: 4 %
HEMATOCRIT: 22.7 % — AB (ref 36.0–46.0)
Hemoglobin: 7.5 g/dL — ABNORMAL LOW (ref 12.0–15.0)
LYMPHS ABS: 2.2 10*3/uL (ref 0.7–4.0)
LYMPHS PCT: 32 %
MCH: 27.2 pg (ref 26.0–34.0)
MCHC: 33 g/dL (ref 30.0–36.0)
MCV: 82.2 fL (ref 78.0–100.0)
MONO ABS: 0.5 10*3/uL (ref 0.1–1.0)
MONOS PCT: 8 %
Neutro Abs: 3.8 10*3/uL (ref 1.7–7.7)
Neutrophils Relative %: 55 %
Platelets: 569 10*3/uL — ABNORMAL HIGH (ref 150–400)
RBC: 2.76 MIL/uL — ABNORMAL LOW (ref 3.87–5.11)
RDW: 19.4 % — AB (ref 11.5–15.5)
WBC: 6.8 10*3/uL (ref 4.0–10.5)

## 2017-04-26 LAB — COMPREHENSIVE METABOLIC PANEL
ALT: 13 U/L — AB (ref 14–54)
ANION GAP: 9 (ref 5–15)
AST: 28 U/L (ref 15–41)
Albumin: 4.4 g/dL (ref 3.5–5.0)
Alkaline Phosphatase: 36 U/L — ABNORMAL LOW (ref 38–126)
BUN: <5 mg/dL — ABNORMAL LOW (ref 6–20)
CO2: 22 mmol/L (ref 22–32)
Calcium: 9.1 mg/dL (ref 8.9–10.3)
Chloride: 109 mmol/L (ref 101–111)
Creatinine, Ser: 0.53 mg/dL (ref 0.44–1.00)
GFR calc Af Amer: >60 mL/min (ref >60)
GFR calc non Af Amer: >60 mL/min (ref >60)
GLUCOSE: 93 mg/dL (ref 65–99)
POTASSIUM: 3.5 mmol/L (ref 3.5–5.1)
Sodium: 140 mmol/L (ref 135–145)
Total Bilirubin: 1.3 mg/dL — ABNORMAL HIGH (ref 0.3–1.2)
Total Protein: 6.9 g/dL (ref 6.5–8.1)

## 2017-04-26 LAB — RETICULOCYTES
RBC.: 2.76 MIL/uL — ABNORMAL LOW (ref 3.87–5.11)
Retic Count, Absolute: 226.3 10*3/uL — ABNORMAL HIGH (ref 19.0–186.0)
Retic Ct Pct: 8.2 % — ABNORMAL HIGH (ref 0.4–3.1)

## 2017-04-26 LAB — I-STAT BETA HCG BLOOD, ED (MC, WL, AP ONLY): I-stat hCG, quantitative: <5 m[IU]/mL (ref <5)

## 2017-04-26 MED ORDER — HYDROMORPHONE HCL 1 MG/ML IJ SOLN: 1.0000 mg | INTRAMUSCULAR | Status: AC

## 2017-04-26 MED ORDER — DEXTROSE-NACL 5-0.45 % IV SOLN
INTRAVENOUS | Status: DC
  Administered 2017-04-26: 16:00:00 via INTRAVENOUS

## 2017-04-26 MED ORDER — HYDROMORPHONE HCL 1 MG/ML IJ SOLN: 1.0000 mg | INTRAMUSCULAR | Status: DC

## 2017-04-26 MED ORDER — ONDANSETRON HCL 4 MG/2ML IJ SOLN
4.0000 mg | INTRAMUSCULAR | Status: DC | PRN
  Administered 2017-04-26: 4 mg via INTRAVENOUS
  Filled 2017-04-26: qty 2

## 2017-04-26 MED ORDER — HYDROMORPHONE HCL 1 MG/ML IJ SOLN: 0.5000 mg | INTRAMUSCULAR | Status: AC

## 2017-04-26 MED ORDER — DIPHENHYDRAMINE HCL 50 MG/ML IJ SOLN
25.0000 mg | Freq: Once | INTRAMUSCULAR | Status: AC
  Administered 2017-04-26: 25 mg via INTRAVENOUS
  Filled 2017-04-26: qty 1

## 2017-04-26 MED ORDER — HYDROMORPHONE HCL 1 MG/ML IJ SOLN
1.0000 mg | INTRAMUSCULAR | Status: AC
  Administered 2017-04-26: 1 mg via INTRAVENOUS
  Filled 2017-04-26: qty 1

## 2017-04-26 MED ORDER — KETOROLAC TROMETHAMINE 30 MG/ML IJ SOLN
30.0000 mg | INTRAMUSCULAR | Status: AC
  Administered 2017-04-26: 30 mg via INTRAVENOUS
  Filled 2017-04-26: qty 1

## 2017-04-26 MED ORDER — OXYCODONE HCL 5 MG PO TABS: 5.0000 mg | ORAL_TABLET | Freq: Four times a day (QID) | ORAL | 0 refills | Status: DC | PRN

## 2017-04-26 MED ORDER — HYDROMORPHONE HCL 1 MG/ML IJ SOLN
0.5000 mg | INTRAMUSCULAR | Status: AC
  Administered 2017-04-26: 0.5 mg via INTRAVENOUS
  Filled 2017-04-26: qty 1

## 2017-04-26 NOTE — ED Provider Notes (Signed)
[facility] EMERGENCY DEPARTMENT Provider Note   CSN: [id number] Arrival date & time: 04/26/17  1040     History   Chief Complaint Chief Complaint  Patient presents with  . Leg Pain  . Sickle Cell Pain Crisis    HPI Wendy [name] is a 27 y.o. female.  Patient with known history of sickle cell disease.  States new to the area but she was admitted here in April 2018 for sickle cell crisis.  Patient states that starting yesterday she got pain all over predominantly in her to both lower extremities.  No fevers no chest pain no shortness of breath.  Patient has not established herself with a primary care provider or the sickle cell clinic.  Not taking any pain medications at home.     Past Medical History:  Diagnosis Date  . Anemia   . Sickle cell anemia [facility])     Patient Active Problem List   Diagnosis Date Noted  . Sickle cell pain crisis ([place]) 05/27/2016  . Leukocytosis 05/27/2016  . Sickle cell anemia (HCC)     Past Surgical History:  Procedure Laterality Date  . CESAREAN SECTION       OB History   None      Home Medications    Prior to Admission medications   Medication Sig Start Date End Date Taking? Authorizing Provider  oxyCODONE (OXY IR/ROXICODONE) 5 MG immediate release tablet Take 1 tablet (5 mg total) by mouth every 4 (four) hours. Patient not taking: Reported on 04/26/2017 06/01/16   Elwyn Reach, MD    Family History Family History  Problem Relation Age of Onset  . Sickle cell trait Father     Social History Social History   Tobacco Use  . Smoking status: Never Smoker  . Smokeless tobacco: Never Used  Substance Use Topics  . Alcohol use: No  . Drug use: No     Allergies   Patient has no known allergies.   Review of Systems Review of Systems  Constitutional: Negative for fever.  HENT: Negative for congestion.   Eyes: Negative for redness.  Respiratory: Negative for shortness of breath.   Cardiovascular:  Negative for chest pain.  Gastrointestinal: Negative for abdominal pain, nausea and vomiting.  Genitourinary: Negative for dysuria and hematuria.  Musculoskeletal: Positive for myalgias.  Skin: Negative for rash.  Neurological: Negative for headaches.  Hematological: Does not bruise/bleed easily.  Psychiatric/Behavioral: Negative for confusion.     Physical Exam Updated Vital Signs BP (!) 117/99   Pulse 100   Resp (!) 22   Ht 1.6 m (5\' 3" )   Wt 61 kg (134 lb 7.7 oz)   LMP 04/25/2017   SpO2 94%   BMI 23.82 kg/m   Physical Exam  Constitutional: She is oriented to person, place, and time. She appears well-developed and well-nourished. No distress.  HENT:  Head: Normocephalic and atraumatic.  Mouth/Throat: Oropharynx is clear and moist.  Eyes: Pupils are equal, round, and reactive to light. Conjunctivae and EOM are normal.  Neck: Neck supple.  Cardiovascular: Normal rate, regular rhythm and normal heart sounds.  Pulmonary/Chest: Effort normal and breath sounds normal. No respiratory distress. She has no wheezes. She has no rales.  Abdominal: Soft. Bowel sounds are normal. There is no tenderness.  Musculoskeletal: Normal range of motion. She exhibits no edema.  Neurological: She is alert and oriented to person, place, and time. No cranial nerve deficit or sensory deficit. She exhibits normal muscle tone. Coordination normal.  Skin: Skin is warm.  Nursing note and vitals reviewed.    ED Treatments / Results  Labs (all labs ordered are listed, but only abnormal results are displayed) Labs Reviewed  COMPREHENSIVE METABOLIC PANEL - Abnormal; Notable for the following components:      Result Value   BUN <5 (*)    ALT 13 (*)    Alkaline Phosphatase 36 (*)    Total Bilirubin 1.3 (*)    All other components within normal limits  CBC WITH DIFFERENTIAL/PLATELET - Abnormal; Notable for the following components:   RBC 2.76 (*)    Hemoglobin 7.5 (*)    HCT 22.7 (*)    RDW 19.4  (*)    Platelets 569 (*)    All other components within normal limits  RETICULOCYTES - Abnormal; Notable for the following components:   Retic Ct Pct 8.2 (*)    RBC. 2.76 (*)    Retic Count, Absolute 226.3 (*)    All other components within normal limits  I-STAT BETA HCG BLOOD, ED ([facility], [facility], [facility] ONLY)    EKG None  Radiology No results found.  Procedures Procedures (including critical care time)  Medications Ordered in ED Medications  dextrose 5 %-0.45 % sodium chloride infusion ( Intravenous Stopped 04/26/17 1832)  ondansetron (ZOFRAN) injection 4 mg (4 mg Intravenous Given 04/26/17 1806)  HYDROmorphone (DILAUDID) injection 1 mg (0 mg Intravenous Hold 04/26/17 1808)    Or  HYDROmorphone (DILAUDID) injection 1 mg ( Subcutaneous See Alternative 04/26/17 1808)  diphenhydrAMINE (BENADRYL) injection 25 mg (25 mg Intravenous Given 04/26/17 1531)  HYDROmorphone (DILAUDID) injection 0.5 mg (0.5 mg Intravenous Given 04/26/17 1533)    Or  HYDROmorphone (DILAUDID) injection 0.5 mg ( Subcutaneous See Alternative 04/26/17 1533)  HYDROmorphone (DILAUDID) injection 1 mg (1 mg Intravenous Given 04/26/17 1628)    Or  HYDROmorphone (DILAUDID) injection 1 mg ( Subcutaneous See Alternative 04/26/17 1628)  HYDROmorphone (DILAUDID) injection 1 mg (1 mg Intravenous Given 04/26/17 1714)    Or  HYDROmorphone (DILAUDID) injection 1 mg ( Subcutaneous See Alternative 04/26/17 1714)  ketorolac (TORADOL) 30 MG/ML injection 30 mg (30 mg Intravenous Given 04/26/17 1529)  diphenhydrAMINE (BENADRYL) injection 25 mg (25 mg Intravenous Given 04/26/17 1804)     Initial Impression / Assessment and Plan / ED Course  I have reviewed the triage vital signs and the nursing notes.  Pertinent labs & imaging results that were available during my care of the patient were reviewed by me and considered in my medical decision making (see chart for details).    Patient symptoms consistent with sickle cell crisis.  No complicating  factors.  Labs without significant abnormalities.  Patient pain controlled well by sickle cell protocol.  Patient given referral to wellness clinic for follow-up and a short course of oxycodone to help with recurrent sickle cell pain.  Patient will return for any new or worse symptoms.   Final Clinical Impressions(s) / ED Diagnoses   Final diagnoses:  Sickle cell anemia with crisis [facility])    ED Discharge Orders    None       Fredia Sorrow, MD 04/26/17 1842

## 2017-04-26 NOTE — ED Triage Notes (Signed)
Pt. Stated, its my  SCC Im hurting all over started yesterday

## 2017-04-26 NOTE — Discharge Instructions (Addendum)
Follow-up with wellness clinic to get herself established due to the fact of the sickle cell.  Today's workup without any acute findings requiring admission.  Take the pain medication as directed and as needed.

## 2017-04-30 ENCOUNTER — Inpatient Hospital Stay (HOSPITAL_COMMUNITY)
Admission: EM | Admit: 2017-04-30 | Discharge: 2017-05-02 | DRG: 812 | Disposition: A | Discharge status: Home / Self Care | Payer: Medicare Other | Attending: Family Medicine | Admitting: Family Medicine

## 2017-04-30 ENCOUNTER — Encounter (HOSPITAL_COMMUNITY): Payer: Self-pay | Admitting: Emergency Medicine

## 2017-04-30 DIAGNOSIS — E876 Hypokalemia: Secondary | ICD-10-CM | POA: Diagnosis not present

## 2017-04-30 DIAGNOSIS — D5700 Hb-SS disease with crisis, unspecified: Secondary | ICD-10-CM | POA: Diagnosis not present

## 2017-04-30 DIAGNOSIS — F17210 Nicotine dependence, cigarettes, uncomplicated: Secondary | ICD-10-CM | POA: Diagnosis present

## 2017-04-30 DIAGNOSIS — D473 Essential (hemorrhagic) thrombocythemia: Secondary | ICD-10-CM

## 2017-04-30 DIAGNOSIS — D649 Anemia, unspecified: Secondary | ICD-10-CM

## 2017-04-30 DIAGNOSIS — D75839 Thrombocytosis, unspecified: Secondary | ICD-10-CM

## 2017-04-30 DIAGNOSIS — R52 Pain, unspecified: Secondary | ICD-10-CM | POA: Diagnosis present

## 2017-04-30 HISTORY — DX: Tobacco use: Z72.0

## 2017-04-30 LAB — COMPREHENSIVE METABOLIC PANEL
ALBUMIN: 4.8 g/dL (ref 3.5–5.0)
ALK PHOS: 43 U/L (ref 38–126)
ALT: 13 U/L — AB (ref 14–54)
AST: 30 U/L (ref 15–41)
Anion gap: 13 (ref 5–15)
CALCIUM: 9.5 mg/dL (ref 8.9–10.3)
CO2: 22 mmol/L (ref 22–32)
CREATININE: 0.54 mg/dL (ref 0.44–1.00)
Chloride: 105 mmol/L (ref 101–111)
GFR calc Af Amer: >60 mL/min (ref >60)
GFR calc non Af Amer: >60 mL/min (ref >60)
GLUCOSE: 102 mg/dL — AB (ref 65–99)
Potassium: 3.4 mmol/L — ABNORMAL LOW (ref 3.5–5.1)
SODIUM: 140 mmol/L (ref 135–145)
Total Bilirubin: 1.1 mg/dL (ref 0.3–1.2)
Total Protein: 7.3 g/dL (ref 6.5–8.1)

## 2017-04-30 LAB — RETICULOCYTES
RBC.: 2.95 MIL/uL — AB (ref 3.87–5.11)
RETIC CT PCT: 12.3 % — AB (ref 0.4–3.1)
Retic Count, Absolute: 362.9 10*3/uL — ABNORMAL HIGH (ref 19.0–186.0)

## 2017-04-30 LAB — CBC WITH DIFFERENTIAL/PLATELET
BASOS PCT: 0 %
Basophils Absolute: 0 10*3/uL (ref 0.0–0.1)
EOS ABS: 0.3 10*3/uL (ref 0.0–0.7)
Eosinophils Relative: 3 %
HCT: 25.2 % — ABNORMAL LOW (ref 36.0–46.0)
Hemoglobin: 8.1 g/dL — ABNORMAL LOW (ref 12.0–15.0)
LYMPHS ABS: 2.8 10*3/uL (ref 0.7–4.0)
Lymphocytes Relative: 28 %
MCH: 27.5 pg (ref 26.0–34.0)
MCHC: 32.1 g/dL (ref 30.0–36.0)
MCV: 85.4 fL (ref 78.0–100.0)
MONO ABS: 0.7 10*3/uL (ref 0.1–1.0)
Monocytes Relative: 7 %
Neutro Abs: 6.2 10*3/uL (ref 1.7–7.7)
Neutrophils Relative %: 62 %
Platelets: 560 10*3/uL — ABNORMAL HIGH (ref 150–400)
RBC: 2.95 MIL/uL — ABNORMAL LOW (ref 3.87–5.11)
RDW: 19.8 % — AB (ref 11.5–15.5)
WBC: 9.9 10*3/uL (ref 4.0–10.5)

## 2017-04-30 LAB — URINALYSIS, ROUTINE W REFLEX MICROSCOPIC
BILIRUBIN URINE: NEGATIVE
Glucose, UA: NEGATIVE mg/dL
Ketones, ur: NEGATIVE mg/dL
NITRITE: NEGATIVE
PROTEIN: NEGATIVE mg/dL
Specific Gravity, Urine: 1.005 (ref 1.005–1.030)
pH: 7 (ref 5.0–8.0)

## 2017-04-30 LAB — HCG, QUANTITATIVE, PREGNANCY: hCG, Beta Chain, Quant, S: <1 m[IU]/mL (ref <5)

## 2017-04-30 MED ORDER — POTASSIUM CHLORIDE CRYS ER 20 MEQ PO TBCR
40.0000 meq | EXTENDED_RELEASE_TABLET | Freq: Once | ORAL | Status: AC
  Administered 2017-04-30: 40 meq via ORAL
  Filled 2017-04-30: qty 2

## 2017-04-30 MED ORDER — NALOXONE HCL 0.4 MG/ML IJ SOLN: 0.4000 mg | INTRAMUSCULAR | Status: DC | PRN

## 2017-04-30 MED ORDER — HYDROMORPHONE HCL 1 MG/ML IJ SOLN: 1.0000 mg | INTRAMUSCULAR | Status: AC

## 2017-04-30 MED ORDER — HYDROMORPHONE HCL 1 MG/ML IJ SOLN: 0.5000 mg | INTRAMUSCULAR | Status: AC

## 2017-04-30 MED ORDER — ENOXAPARIN SODIUM 40 MG/0.4ML ~~LOC~~ SOLN
40.0000 mg | Freq: Every day | SUBCUTANEOUS | Status: DC
  Administered 2017-05-02: 40 mg via SUBCUTANEOUS
  Filled 2017-04-30 (×3): qty 0.4

## 2017-04-30 MED ORDER — NICOTINE 7 MG/24HR TD PT24
7.0000 mg | MEDICATED_PATCH | Freq: Every day | TRANSDERMAL | Status: DC | PRN
  Filled 2017-04-30: qty 1

## 2017-04-30 MED ORDER — DIPHENHYDRAMINE HCL 25 MG PO CAPS
25.0000 mg | ORAL_CAPSULE | ORAL | Status: DC | PRN
  Administered 2017-04-30: 25 mg via ORAL
  Filled 2017-04-30: qty 1

## 2017-04-30 MED ORDER — DIPHENHYDRAMINE HCL 25 MG PO CAPS
50.0000 mg | ORAL_CAPSULE | ORAL | Status: DC | PRN
  Administered 2017-05-01 – 2017-05-02 (×3): 50 mg via ORAL
  Filled 2017-04-30 (×3): qty 2

## 2017-04-30 MED ORDER — HYDROMORPHONE HCL 1 MG/ML IJ SOLN
1.0000 mg | INTRAMUSCULAR | Status: AC
  Administered 2017-04-30: 1 mg via INTRAVENOUS
  Filled 2017-04-30: qty 1

## 2017-04-30 MED ORDER — KETOROLAC TROMETHAMINE 30 MG/ML IJ SOLN
30.0000 mg | INTRAMUSCULAR | Status: AC
  Administered 2017-04-30: 30 mg via INTRAVENOUS
  Filled 2017-04-30: qty 1

## 2017-04-30 MED ORDER — HYDROMORPHONE HCL 1 MG/ML IJ SOLN: 1.0000 mg | INTRAMUSCULAR | Status: DC | PRN

## 2017-04-30 MED ORDER — ONDANSETRON HCL 4 MG/2ML IJ SOLN
4.0000 mg | INTRAMUSCULAR | Status: DC | PRN
  Administered 2017-04-30: 4 mg via INTRAVENOUS
  Filled 2017-04-30: qty 2

## 2017-04-30 MED ORDER — KETOROLAC TROMETHAMINE 10 MG PO TABS: 15.0000 mg | ORAL_TABLET | Freq: Four times a day (QID) | ORAL | Status: DC

## 2017-04-30 MED ORDER — HYDROMORPHONE HCL 1 MG/ML IJ SOLN
0.5000 mg | INTRAMUSCULAR | Status: AC
  Administered 2017-04-30: 0.5 mg via INTRAVENOUS
  Filled 2017-04-30: qty 1

## 2017-04-30 MED ORDER — KETOROLAC TROMETHAMINE 10 MG PO TABS: 10.0000 mg | ORAL_TABLET | Freq: Three times a day (TID) | ORAL | Status: DC

## 2017-04-30 MED ORDER — SODIUM CHLORIDE 0.9% FLUSH: 9.0000 mL | INTRAVENOUS | Status: DC | PRN

## 2017-04-30 MED ORDER — HYDROMORPHONE 1 MG/ML IV SOLN
INTRAVENOUS | Status: DC
  Administered 2017-05-01: 01:00:00 via INTRAVENOUS
  Filled 2017-04-30: qty 25

## 2017-04-30 MED ORDER — KETOROLAC TROMETHAMINE 30 MG/ML IJ SOLN
15.0000 mg | Freq: Four times a day (QID) | INTRAMUSCULAR | Status: DC
  Administered 2017-05-01: 15 mg via INTRAVENOUS
  Filled 2017-04-30: qty 1

## 2017-04-30 MED ORDER — HYDROMORPHONE HCL 1 MG/ML IJ SOLN
1.0000 mg | Freq: Once | INTRAMUSCULAR | Status: AC
  Administered 2017-04-30: 1 mg via INTRAVENOUS
  Filled 2017-04-30: qty 1

## 2017-04-30 MED ORDER — HYDROMORPHONE HCL 1 MG/ML IJ SOLN
0.5000 mg | Freq: Once | INTRAMUSCULAR | Status: AC
  Administered 2017-04-30: 0.5 mg via SUBCUTANEOUS

## 2017-04-30 MED ORDER — DEXTROSE-NACL 5-0.45 % IV SOLN
INTRAVENOUS | Status: DC
  Administered 2017-04-30 – 2017-05-01 (×2): via INTRAVENOUS

## 2017-04-30 NOTE — H&P (Addendum)
[facility] Admission History and Physical Service Pager: [phone]  Patient name: Wendy [name] Medical record number: [id number] Date of birth: 1990/06/22 Age: 27 y.o. Gender: female  Primary Care Provider: Patient, No Pcp Per Consultants: none Code Status: full  Chief Complaint: ss pain crisis  Assessment and Plan: Wendy [name] is a 27 y.o. female presenting with ss pain crisis . PMH is significant for ss disease, bilateral tubal ligation, 2x csections, tobacco abuse  SS pain crisis- Described as 10/10 pain "everywhere" that has been on and off since her last ED visit on 3/24.  She has had multiple short course narcotic rx/s for pain crisis in the last few months but does not appear to have a stable pcp prescriber which matches her story.  She firmly denies any current or recent respiratory symptoms or fevers. No indication of acute chest.  She is afebrile, well appearing. No chest pain above baseline pain "everwhere".  Lungs clear to auscultation and satting 100 on RA.  Patient does say she still has spleen. -admit to med-surg, Dr. Erin Hearing -dilaudid low dose PCA -IV toradol 15 Q6 -benadryl 50 q6 as needed for itching -O2 supplementation as needed -continuous pulse ox -morning CBC w/ retic -BMP -will attempt to connect with outpatient SS management (day team) to coordinate starting hydroxyurea -patient needs pcp (Sw consult placed) -continue D5 1/2 NS @125 /hr  Anemia- Hgb is stable at 8.1 with prior data showing 7.5-8 as baseline. Reticulocytes 12.3 % which is appropriate. Not symptomatic in terms of anemia. In setting of acute pain crisis will monitor closely for changes in hemoglobin and symptoms. -am CBC, retic -transfuse if symptomatic or hemoglobin level drops  Of reproductive age: patient with BTL says her sister got pregnant after BTL and is requesting nexplanon. Urine pregnancy negative. -discuss in/out patient options with day  team  Hypokalemia- mildly low K of 3.4 on admission, received oral repletion 40 meq x 1 in ED. -am BMP, replete as needed   Tobacco use: "years" of a cigarette or two per day, she declines offer of nicotine patch -will offer nicotine patch prn  FEN/GI: heart healthy Prophylaxis: lovenox  Disposition: obs with likely d/c 3/29 if pain controlled  History of Present Illness:  Wendy [name] is a 27 y.o. female presenting with pain she describes as consistent with multiple past SS pain crisis.  It is Described as 10/10 pain "everywhere" that has been on and off since her last ED visit on 3/24.  She has had multiple short course narcotic rx/s for pain crisis in the last few months but does not appear to have a stable pcp prescriber which matches her story.  She firmly denies any current or recent respiratory symptoms or fevers.  No chest pain above baseline pain, no coughing or sick symptoms, no abdominal pain.  Denies LOC, SOB, dysuria, new rashes, discharge/bleeding, swelling in legs, parethesias, visual changes, headaches  Was getting narcotic/hydroxyurea from pain clinic in [place], [phone] in the past but isn't sure how long ago that was.  Does not currently have pcp or sickle cell management outpatient as she has just moved back to [place] in the last few weeks from [employer] in demographics is decision maker should patient be incapacitated  Review Of Systems: Per HPI with the following additions:   Review of Systems  Constitutional: Negative for chills and fever.  HENT: Negative for congestion, sinus pain and sore throat.   Eyes: Negative for blurred vision.  Respiratory: Negative for cough, hemoptysis, shortness of breath, wheezing and stridor.   Cardiovascular: Negative for chest pain and leg swelling.  Gastrointestinal: Negative for abdominal pain, constipation and vomiting.  Genitourinary: Negative for dysuria and hematuria.    Patient Active Problem List    Diagnosis Date Noted  . Sickle cell pain crisis ([place]) 05/27/2016  . Leukocytosis 05/27/2016  . Sickle cell anemia (HCC)     Past Medical History: Past Medical History:  Diagnosis Date  . Anemia   . Sickle cell anemia (HCC)     Past Surgical History: Past Surgical History:  Procedure Laterality Date  . CESAREAN SECTION      Social History: 1-2 cigarettes/day smoker  Additional social history:  Just moved back to [place]. Denies alcohol or illicit drugs. Please also refer to relevant sections of EMR.  Family History: Family History  Problem Relation Age of Onset  . Sickle cell trait Father      Allergies and Medications: No Known Allergies No current facility-administered medications on file prior to encounter.    Current Outpatient Medications on File Prior to Encounter  Medication Sig Dispense Refill  . oxyCODONE (OXY IR/ROXICODONE) 5 MG immediate release tablet Take 1 tablet (5 mg total) by mouth every 4 (four) hours. (Patient not taking: Reported on 04/26/2017) 30 tablet 0  . oxyCODONE (ROXICODONE) 5 MG immediate release tablet Take 1 tablet (5 mg total) by mouth every 6 (six) hours as needed for severe pain. (Patient not taking: Reported on 04/30/2017) 10 tablet 0    Objective: BP 115/73   Pulse 73   Temp 99.8 F (37.7 C) (Oral)   Resp (!) 21   Ht 5\' 5"  (1.651 m)   Wt 125 lb (56.7 kg)   LMP 04/25/2017 Comment: tubal ligation  SpO2 100%   BMI 20.80 kg/m  Exam: General: NAD, stable on room air, itchy Eyes: clear sclera, no drainage, no reported visual changes, EOMI ENTM: moist mucous membranes, no rhniorhea, no epistaxis, no drooling Neck: no visible abnormality or noted ROM restrictions Cardiovascular: RRR, no murmurs no exam Respiratory: CTAB, no IWB, no cough, no wheeze/crackles Gastrointestinal: no belly pain to deep palpation, soft with no masses noted MSK: no swelling or distal paresthesia/perfusion deficits Derm: no lesions noted on exposed  skin Neuro: grossly intact with no deficits/complaints Psych: appropriate, alert aox3  Labs and Imaging: CBC BMET  Recent Labs  Lab 04/30/17 1659  WBC 9.9  HGB 8.1*  HCT 25.2*  PLT 560*   Recent Labs  Lab 04/30/17 1659  NA 140  K 3.4*  CL 105  CO2 22  BUN <5*  CREATININE 0.54  GLUCOSE 102*  CALCIUM 9.5     No results found.  Sherene Sires, DO 04/30/2017, 10:25 PM PGY-1, [place] Intern pager: [phone], text pages welcome   FPTS Upper-Level Resident Addendum  I have independently interviewed and examined the patient. I have discussed the above with the original author and agree with their documentation. My edits for correction/addition/clarification are in pink.Please see also any attending notes.   Lucila Maine, DO PGY-2, [place] Service pager: [phone] (text pages welcome through [place])

## 2017-04-30 NOTE — Discharge Instructions (Addendum)
Please continue taking acetaminophen (Tylenol) two tablets every 6 hours, and naproxen (Naprosyn) one tablet every 12 hours. If you are still having pain despite these medications, you can take one oxycodone tablet up to every 6 hours as needed for severe pain.   If you develop chest pain or difficulty breathing, please return to the emergency room.   It is important to make sure you go to your follow-up appointment with Dr. Criss Rosales at the [facility] on 04/02 at [facility]. It is also important to go to your appointment with the [facility] on 04/18.

## 2017-04-30 NOTE — ED Triage Notes (Signed)
Per [facility]: Pt to ED from home c/o sickle cell pain crisis. Patient reports it is the same as 4 days ago, but started yesterday and is "all over." She denies fevers/chills, no SOB/CP. Resp e/u, breath sounds clear, skin warm/dry. Patient ambulatory from ambulance to triage. No apparent distress noted.

## 2017-04-30 NOTE — ED Provider Notes (Signed)
[facility] [facility] EMERGENCY DEPARTMENT Provider Note   CSN: [id number] Arrival date & time: 04/30/17  1629     History   Chief Complaint Chief Complaint  Patient presents with  . Sickle Cell Pain Crisis    HPI Daneille Desilva is a 27 y.o. female with a PMHx of sickle cell anemia, who presents to the ED with complaints of sickle cell pain crisis that began 4 days ago.  Patient states that she is having pain everywhere over her entire body which is similar to her prior pain crises.  She was seen here 4 days ago, had a reassuring workup, received pain medications and improved so she was discharged home with an oxycodone prescription.  She comes back today because she continues to have pain.  She describes the pain as 10/10 constant achy and sharp generalized body pain, worse with being cold, and unrelieved with Tylenol and ibuprofen.  She denies having any narcotics at home previously/chronically, recently moved back to the area so she has not establish PCP care yet.  She is a non-smoker, denies any recent changes in elevation or travel, and denies any sick contacts.  She also denies any fevers, chills, cough, URI symptoms, CP, SOB, LE swelling or ulcerations, abd pain, N/V/D/C, hematuria, dysuria, vaginal bleeding/discharge, numbness, tingling, focal weakness, rashes, or any other complaints at this time.   The history is provided by the patient and medical records. No language interpreter was used.  Sickle Cell Pain Crisis  Location:  Diffuse Severity:  Moderate Onset quality:  Gradual Duration:  4 days Similar to previous crisis episodes: yes   Timing:  Constant Progression:  Unchanged Chronicity:  Recurrent Relieved by:  Nothing Exacerbated by: being cold. Ineffective treatments:  OTC medications Associated symptoms: no chest pain, no cough, no fever, no nausea, no shortness of breath, no sore throat and no vomiting   Risk factors: no elevation change, no recent air travel  and no smoking     Past Medical History:  Diagnosis Date  . Anemia   . Sickle cell anemia [facility])     Patient Active Problem List   Diagnosis Date Noted  . Sickle cell pain crisis ([place]) 05/27/2016  . Leukocytosis 05/27/2016  . Sickle cell anemia (HCC)     Past Surgical History:  Procedure Laterality Date  . CESAREAN SECTION       OB History   None      Home Medications    Prior to Admission medications   Medication Sig Start Date End Date Taking? Authorizing Provider  oxyCODONE (OXY IR/ROXICODONE) 5 MG immediate release tablet Take 1 tablet (5 mg total) by mouth every 4 (four) hours. Patient not taking: Reported on 04/26/2017 06/01/16   Elwyn Reach, MD  oxyCODONE (ROXICODONE) 5 MG immediate release tablet Take 1 tablet (5 mg total) by mouth every 6 (six) hours as needed for severe pain. Patient not taking: Reported on 04/30/2017 04/26/17   Fredia Sorrow, MD    Family History Family History  Problem Relation Age of Onset  . Sickle cell trait Father     Social History Social History   Tobacco Use  . Smoking status: Never Smoker  . Smokeless tobacco: Never Used  Substance Use Topics  . Alcohol use: No  . Drug use: No     Allergies   Patient has no known allergies.   Review of Systems Review of Systems  Constitutional: Negative for chills and fever.  HENT: Negative for rhinorrhea and sore  throat.   Respiratory: Negative for cough and shortness of breath.   Cardiovascular: Negative for chest pain and leg swelling.  Gastrointestinal: Negative for abdominal pain, constipation, diarrhea, nausea and vomiting.  Genitourinary: Negative for dysuria, hematuria, vaginal bleeding and vaginal discharge.  Musculoskeletal: Positive for arthralgias and myalgias.  Skin: Negative for color change and rash.  Allergic/Immunologic: Positive for immunocompromised state (sickle cell).  Neurological: Negative for weakness and numbness.  Psychiatric/Behavioral: Negative  for confusion.   All other systems reviewed and are negative for acute change except as noted in the HPI.    Physical Exam Updated Vital Signs BP 131/79   Pulse 96   Temp 99.8 F (37.7 C) (Oral)   Resp (!) 25   Ht 5\' 5"  (1.651 m)   Wt 56.7 kg (125 lb)   LMP 04/25/2017 Comment: tubal ligation  SpO2 100%   BMI 20.80 kg/m   Physical Exam  Constitutional: She is oriented to person, place, and time. Vital signs are normal. She appears well-developed and well-nourished.  Non-toxic appearance. No distress.  Afebrile, nontoxic, NAD  HENT:  Head: Normocephalic and atraumatic.  Mouth/Throat: Oropharynx is clear and moist and mucous membranes are normal.  Eyes: Conjunctivae and EOM are normal. Right eye exhibits no discharge. Left eye exhibits no discharge.  Neck: Normal range of motion. Neck supple.  Cardiovascular: Normal rate, regular rhythm, normal heart sounds and intact distal pulses. Exam reveals no gallop and no friction rub.  No murmur heard. Pulmonary/Chest: Effort normal and breath sounds normal. No respiratory distress. She has no decreased breath sounds. She has no wheezes. She has no rhonchi. She has no rales.  CTAB in all lung fields, no w/r/r, no hypoxia or increased WOB, speaking in full sentences, SpO2 100% on RA   Abdominal: Soft. Normal appearance and bowel sounds are normal. She exhibits no distension. There is no tenderness. There is no rigidity, no rebound, no guarding, no CVA tenderness, no tenderness at McBurney's point and negative Murphy's sign.  Soft, NTND, +BS throughout, no r/g/r, neg murphy's, neg mcburney's, no CVA TTP   Musculoskeletal: Normal range of motion.  Diffuse tenderness to b/l extremities, no focal area of bony or joint TTP, no joint swelling or pedal edema, no crepitus or deformity, no bruising or erythema/warmth to any extremity. FROM intact in all major joints.  MAE x4 Strength and sensation grossly intact in all extremities Distal pulses  intact No pedal edema, neg homan's bilaterally   Neurological: She is alert and oriented to person, place, and time. She has normal strength. No sensory deficit.  Skin: Skin is warm, dry and intact. No rash noted.  Psychiatric: She has a normal mood and affect.  Nursing note and vitals reviewed.    ED Treatments / Results  Labs (all labs ordered are listed, but only abnormal results are displayed) Labs Reviewed  COMPREHENSIVE METABOLIC PANEL - Abnormal; Notable for the following components:      Result Value   Potassium 3.4 (*)    Glucose, Bld 102 (*)    BUN <5 (*)    ALT 13 (*)    All other components within normal limits  CBC WITH DIFFERENTIAL/PLATELET - Abnormal; Notable for the following components:   RBC 2.95 (*)    Hemoglobin 8.1 (*)    HCT 25.2 (*)    RDW 19.8 (*)    Platelets 560 (*)    All other components within normal limits  RETICULOCYTES - Abnormal; Notable for the following components:  Retic Ct Pct 12.3 (*)    RBC. 2.95 (*)    Retic Count, Absolute 362.9 (*)    All other components within normal limits  HCG, QUANTITATIVE, PREGNANCY  URINALYSIS, ROUTINE W REFLEX MICROSCOPIC    EKG None  Radiology No results found.  Procedures Procedures (including critical care time)  Medications Ordered in ED Medications  dextrose 5 %-0.45 % sodium chloride infusion ( Intravenous New Bag/Given 04/30/17 2053)  diphenhydrAMINE (BENADRYL) capsule 25-50 mg (25 mg Oral Given 04/30/17 2013)  ondansetron (ZOFRAN) injection 4 mg (4 mg Intravenous Given 04/30/17 2012)  HYDROmorphone (DILAUDID) injection 1 mg (has no administration in time range)  HYDROmorphone (DILAUDID) injection 0.5 mg (0.5 mg Subcutaneous Given 04/30/17 1741)  potassium chloride SA (K-DUR,KLOR-CON) CR tablet 40 mEq (40 mEq Oral Given 04/30/17 2017)  ketorolac (TORADOL) 30 MG/ML injection 30 mg (30 mg Intravenous Given 04/30/17 2013)  HYDROmorphone (DILAUDID) injection 0.5 mg (0.5 mg Intravenous Given 04/30/17  2013)    Or  HYDROmorphone (DILAUDID) injection 0.5 mg ( Subcutaneous See Alternative 04/30/17 2013)  HYDROmorphone (DILAUDID) injection 1 mg (1 mg Intravenous Given 04/30/17 2053)    Or  HYDROmorphone (DILAUDID) injection 1 mg ( Subcutaneous See Alternative 04/30/17 2053)  HYDROmorphone (DILAUDID) injection 1 mg (1 mg Intravenous Given 04/30/17 2129)    Or  HYDROmorphone (DILAUDID) injection 1 mg ( Subcutaneous See Alternative 04/30/17 2129)     Initial Impression / Assessment and Plan / ED Course  I have reviewed the triage vital signs and the nursing notes.  Pertinent labs & imaging results that were available during my care of the patient were reviewed by me and considered in my medical decision making (see chart for details).     27 y.o. female here with sickle cell pain crisis x4 days, was seen here 4 days ago and labs were reassuring, she received several doses of pain meds and improved so she went home with an rx for oxycodone. Reports still having pain so came back for repeat eval. On exam, afebrile and nontoxic, in NAD, with diffuse tenderness to b/l extremities, no focal area of tenderness, no pedal edema, no tachycardia or hypoxia, clear lungs, no abdominal tenderness. Work up thus far reveals: CMP with marginally low K 3.4 will orally replete now; CBC w/diff with stable anemia and no leukocytosis, also with stable thrombocytosis; retics elevated similarly to prior visit; HCG pending. Will get U/A, give pain meds and fluids, then reassess shortly.   9:55 PM HCG negative. U/A pending. Pt still not feeling much better, still in pain; at this point, she's been given a total of 4 doses of dilaudid (0.5mg  SQ, 0.5mg  IV, then 1mg  IV x2) therefore will proceed with admission for sickle cell pain crisis. Discussed case with my attending Dr. Thomasene Lot who agrees with plan.   10:08 PM Dr. Criss Rosales of [facility] returning page and will admit. Holding orders to be placed by admitting  team. Please see their notes for further documentation of care. I appreciate their help with this pleasant pt's care. Pt stable at time of admission.    Final Clinical Impressions(s) / ED Diagnoses   Final diagnoses:  Sickle cell pain crisis ([place])  Hypokalemia  Chronic anemia  Thrombocytosis [facility])    ED Discharge Orders    [address] [place], [place] 04/30/17 2208    Macarthur Critchley, MD 05/01/17 2012

## 2017-05-01 ENCOUNTER — Other Ambulatory Visit: Payer: Self-pay

## 2017-05-01 DIAGNOSIS — D473 Essential (hemorrhagic) thrombocythemia: Secondary | ICD-10-CM

## 2017-05-01 DIAGNOSIS — D5700 Hb-SS disease with crisis, unspecified: Principal | ICD-10-CM

## 2017-05-01 DIAGNOSIS — F17210 Nicotine dependence, cigarettes, uncomplicated: Secondary | ICD-10-CM | POA: Diagnosis present

## 2017-05-01 DIAGNOSIS — E876 Hypokalemia: Secondary | ICD-10-CM

## 2017-05-01 DIAGNOSIS — D649 Anemia, unspecified: Secondary | ICD-10-CM | POA: Diagnosis not present

## 2017-05-01 LAB — BASIC METABOLIC PANEL WITH GFR
Anion gap: 7 (ref 5–15)
BUN: <5 mg/dL — ABNORMAL LOW (ref 6–20)
CO2: 24 mmol/L (ref 22–32)
Calcium: 8.7 mg/dL — ABNORMAL LOW (ref 8.9–10.3)
Chloride: 108 mmol/L (ref 101–111)
Creatinine, Ser: 0.5 mg/dL (ref 0.44–1.00)
GFR calc Af Amer: >60 mL/min
GFR calc non Af Amer: >60 mL/min
Glucose, Bld: 115 mg/dL — ABNORMAL HIGH (ref 65–99)
Potassium: 3.3 mmol/L — ABNORMAL LOW (ref 3.5–5.1)
Sodium: 139 mmol/L (ref 135–145)

## 2017-05-01 LAB — CBC WITH DIFFERENTIAL/PLATELET
Basophils Absolute: 0 K/uL (ref 0.0–0.1)
Basophils Relative: 0 %
Eosinophils Absolute: 0.4 K/uL (ref 0.0–0.7)
Eosinophils Relative: 4 %
HCT: 21.9 % — ABNORMAL LOW (ref 36.0–46.0)
Hemoglobin: 7.3 g/dL — ABNORMAL LOW (ref 12.0–15.0)
Lymphocytes Relative: 39 %
Lymphs Abs: 4.2 K/uL — ABNORMAL HIGH (ref 0.7–4.0)
MCH: 28.3 pg (ref 26.0–34.0)
MCHC: 33.3 g/dL (ref 30.0–36.0)
MCV: 84.9 fL (ref 78.0–100.0)
Monocytes Absolute: 0.8 K/uL (ref 0.1–1.0)
Monocytes Relative: 7 %
Neutro Abs: 5.3 K/uL (ref 1.7–7.7)
Neutrophils Relative %: 50 %
Platelets: 494 K/uL — ABNORMAL HIGH (ref 150–400)
RBC: 2.58 MIL/uL — ABNORMAL LOW (ref 3.87–5.11)
RDW: 19.5 % — ABNORMAL HIGH (ref 11.5–15.5)
WBC: 10.7 K/uL — ABNORMAL HIGH (ref 4.0–10.5)

## 2017-05-01 LAB — RETICULOCYTES
RBC.: 2.58 MIL/uL — AB (ref 3.87–5.11)
RETIC CT PCT: 11.5 % — AB (ref 0.4–3.1)
Retic Count, Absolute: 296.7 10*3/uL — ABNORMAL HIGH (ref 19.0–186.0)

## 2017-05-01 LAB — MAGNESIUM: Magnesium: 1.8 mg/dL (ref 1.7–2.4)

## 2017-05-01 MED ORDER — OXYCODONE HCL 5 MG PO TABS
15.0000 mg | ORAL_TABLET | Freq: Four times a day (QID) | ORAL | Status: DC | PRN
  Administered 2017-05-01 – 2017-05-02 (×4): 15 mg via ORAL
  Filled 2017-05-01 (×4): qty 3

## 2017-05-01 MED ORDER — POTASSIUM CHLORIDE CRYS ER 20 MEQ PO TBCR
40.0000 meq | EXTENDED_RELEASE_TABLET | Freq: Once | ORAL | Status: AC
  Administered 2017-05-01: 40 meq via ORAL
  Filled 2017-05-01: qty 2

## 2017-05-01 MED ORDER — NAPROXEN 250 MG PO TABS
500.0000 mg | ORAL_TABLET | Freq: Two times a day (BID) | ORAL | Status: DC
  Administered 2017-05-01 – 2017-05-02 (×2): 500 mg via ORAL
  Filled 2017-05-01 (×2): qty 1
  Filled 2017-05-01 (×3): qty 2

## 2017-05-01 MED ORDER — ACETAMINOPHEN 325 MG PO TABS
650.0000 mg | ORAL_TABLET | Freq: Four times a day (QID) | ORAL | Status: DC
  Filled 2017-05-01 (×3): qty 2

## 2017-05-01 MED ORDER — HYDROMORPHONE 1 MG/ML IV SOLN: INTRAVENOUS | Status: DC

## 2017-05-01 NOTE — Progress Notes (Signed)
CSW acknowledges consult for PCP. CSW spoke with pt at bedside and confirmed that at this time this is pt's only need. CSW had updated RNCM of this need at this time. CSW will sign off at this on pt. Please re-consult if further needs arise.    Virgie Dad Jinx Gilden, MSW, [place] [facility] Clinical Social Worker [phone]

## 2017-05-01 NOTE — Progress Notes (Signed)
Family Medicine Teaching Service Daily Progress Note Intern Pager: [phone]  Patient name: Wendy [name] Medical record number: [id number] Date of birth: December 11, 1990 Age: 27 y.o. Gender: female  Primary Care Provider: Patient, No Pcp Per Consultants: None Code Status: Full   Assessment and Plan: Sickle cell pain crisis Patient presented to ED for 4 day history of diffuse,10/10 pain throughout her body, Recently moved from [place]. No recent PCP or consistent therapy for SS, but has has had multiple short courses of narcotics prescribed for pain crisis. Patient denies chest pain, shortness of breath, or subjective fever. - Stop Diluadid PCA, start 15 mg oxycodone PO q6hr, schedule Tylenol 650 prn, and Naproxen 500 mg BID  - stop IV toradol  - benadryl prn for itching - continuous pulse ox - Case management consulted for PCP and [facility] SS program   Anemia, Hgb 7.3, baseline 7.5-8 -Transfuse if patient becomes symptomatic, or if Hgb drops below 7  Hypokalemia, K 3.3  - Replete KDUR x 1, check Magnesium   Tobacco use -Pt admits "years" of smoking 1-2 cigarettes per day. - Currently has nicotinine patch order   FEN/GI: heart healthy PPx: Lovenox  Disposition: Home   Objective: Temp:  [98.1 F (36.7 C)-99.8 F (37.7 C)] 98.1 F (36.7 C) (03/29 1321) Pulse Rate:  [70-96] 70 (03/29 1321) Resp:  [12-27] 18 (03/29 1321) BP: (100-147)/(66-118) 110/74 (03/29 1321) SpO2:  [99 %-100 %] 100 % (03/29 1321) Weight:  [125 lb (56.7 kg)-142 lb 13.7 oz (64.8 kg)] 142 lb 13.7 oz (64.8 kg) (03/29 1321) Physical Exam: General: Patient lying in bed, looking at her phone, NAD  Cardiovascular:RRR, no murmurs or rubs noted  Respiratory:  CTAB, no increased WOB Abdomen: BS+, no ttp  Extremities: No lower extremity edema, moving all extremities    UPPER LEVEL ADDENDUM  Wendy Mo, MD, PGY-3 [name]    Pt Overview and Major Events to Date:  Wendy [name] is 27 yo  female who presents with sickle cell pain crisis, with PMH significant for SS disease, bilateral tubal ligation, CS x2, and tobacco use.  Assessment and Plan:  Sickle cell pain crisis Patient presented to ED for 4 day history of diffuse,10/10 pain, described as sharp and achy,occuring intermittently since last ED visit on 3/24. Patient has had no recent PCP or consistent therapy for SS, but has has had multiple short courses of narcotics prescribed for pain crisis. Patient denies chest pain, shortness of breath, or subjective fever. - Overnight Patient requested pain control more frequently than scheduled doses, therefore transitioned to PCA Dilaudid 1mg /52ml  -Today transition from PCA to PO pain medication using morphine equivalent of administerd PCA: 15 mg oxycodone PO q6hr -IV toradol  -benadryl prn for itching -2L O2 supplementation per PCA protocol - continuous pulse ox - Case management consulted for PCP placement - Arrange placement with [facility] Sickle Cell program  Anemia -Hgb down to 7.3 from 8.1 at admission, below baseline 7.5-8 -Retic count 11.5 appropriate -Transfuse if patient becomes symptomatic, or if Hgb drops below 7  Of reproductive age -Urine pregnancy test negative -Hx of bilateral tubal ligation, patient states her sister became pregnant after BTL - Pt requests nexplanon - Administer contraception on outpatient basis  Hypokalemia -Mild K 3.3 this am, down from 3.4 on admission despite 1x 40 meq oral repletion in ED. -Replete to normal range with 40 meq KCl  Tobacco use -Pt admits "years" of smoking 1-2 cigarettes per day. -Declines Prn nicotine patch  FEN/GI: heart  healthy PPx: Lovenox  Disposition: Discharge if PO pain meds well tolerated, and pain adequate controlled  Subjective:  This morning the patient denies adequate pain control on PCA dilaudid, Objective: Temp:  [99.8 F (37.7 C)] 99.8 F (37.7 C) (03/28 1636) Pulse Rate:  [73-96] 73 (03/29  0638) Resp:  [12-27] 16 (03/29 3846) BP: (100-147)/(66-118) 116/66 (03/29 6599) SpO2:  [99 %-100 %] 100 % (03/29 3570) Weight:  [125 lb (56.7 kg)] 125 lb (56.7 kg) (03/28 1944) Physical Exam: General: NAD, drowsy, cooperative Cardiovascular: RRR, no RMG, peripheral pulses intact Respiratory: Clear to auscultation bilaterally Abdomen: non-distended, normoactive bowel sounds  Extremities: no edema  Laboratory: Recent Labs  Lab 04/26/17 1110 04/30/17 1659  WBC 6.8 9.9  HGB 7.5* 8.1*  HCT 22.7* 25.2*  PLT 569* 560*   Recent Labs  Lab 04/26/17 1110 04/30/17 1659  NA 140 140  K 3.5 3.4*  CL 109 105  CO2 22 22  BUN <5* <5*  CREATININE 0.53 0.54  CALCIUM 9.1 9.5  PROT 6.9 7.3  BILITOT 1.3* 1.1  ALKPHOS 36* 43  ALT 13* 13*  AST 28 30  GLUCOSE 93 102*    Dike, Wendy [name], Medical Student 05/01/2017, 7:26 AM FPTS Intern pager: [phone], text pages welcome

## 2017-05-01 NOTE — Progress Notes (Signed)
INTERIM PROGRESS  Paged by ED charge about possibility of transferring patient to [facility] due to large bed hold here and [facility] having sickle cell center.  Went down to evaluate patient who was now charted a [place].  Patient does not clinically require 2L  on examination but is on that per the PCA protocol.  She is having no respiratory complaints at all.  She does complain of persistent pain so I will evaluate most appropriate increase in current pain meds.  While in ED, I heard from RN that [facility] does not have beds either.  Given patient's reassuring presentation she will stay here.  -Dr. Criss Rosales

## 2017-05-01 NOTE — ED Notes (Signed)
PCA stopped, report given to 23M-

## 2017-05-01 NOTE — ED Notes (Signed)
PCA pump 20 mg of Dilaudid wasted, witnessed by Audry Riles RN.

## 2017-05-02 LAB — HEMOGLOBIN AND HEMATOCRIT, BLOOD
HEMATOCRIT: 24.9 % — AB (ref 36.0–46.0)
HEMOGLOBIN: 7.9 g/dL — AB (ref 12.0–15.0)

## 2017-05-02 MED ORDER — OXYCODONE HCL 15 MG PO TABS: 15.0000 mg | ORAL_TABLET | Freq: Four times a day (QID) | ORAL | 0 refills | Status: DC | PRN

## 2017-05-02 MED ORDER — NAPROXEN 500 MG PO TABS: 500.0000 mg | ORAL_TABLET | Freq: Two times a day (BID) | ORAL | 0 refills | Status: DC

## 2017-05-02 MED ORDER — ACETAMINOPHEN 325 MG PO TABS: 650.0000 mg | ORAL_TABLET | Freq: Four times a day (QID) | ORAL | 0 refills | Status: DC

## 2017-05-02 NOTE — Progress Notes (Addendum)
  Family Medicine Teaching Service Daily Progress Note Intern Pager: [phone]  Patient name: Wendy [name] Medical record number: [id number] Date of birth: March 25, 1990 Age: 27 y.o. Gender: female  Primary Care Provider: Patient, No Pcp Per Consultants: None Code Status: Full   Pt Overview and Major Events to Date:  Wendy [name] is 27 yo female who presents with sickle cell pain crisis, with PMH significant for SS disease, bilateral tubal ligation, CS x2, and tobacco use.  Assessment and Plan: Sickle cell pain crisis Patient presented to ED for 4 day history of diffuse,10/10 pain throughout her body, Recently moved from [place]. No recent PCP or consistent therapy for SS, but has has had multiple short courses of narcotics prescribed for pain crisis. Patient denies chest pain, shortness of breath, or subjective fever. PCA and Toradol discontinued yesterday (03/29).  - Cont regimen of 15 mg oxycodone PO q6hr, schedule Tylenol 650 prn, and Naproxen 500 mg BID  - Benadryl PRN itching - Continuous pulse ox - Case management consulted for PCP and [facility] SS program  - Will likely need to resume hydroxyurea, however as patient has not been taking this regularly, would be best managed by sickle cell clinic provider.   Anemia: Hgb 7.3 (03/29), baseline 7.5-8 -Transfuse if patient becomes symptomatic, or if Hgb drops below 7  Hypokalemia: K 3.3 (03/29). Mag WNL. Repleting with KDur as needed.  - Repeat BMP tomorrow AM  Tobacco abuse: Pt admits "years" of smoking 1-2 cigarettes per day. Declines nicotinine patch.    FEN/GI: heart healthy PPx: Lovenox  Disposition: Home when medically stable  Subjective: Patient says she feels better today than yesterday, however is still having low back pain (same location that pain has been in). Feels as if she is moving in the right direction, but does not feel comfortable going home just yet.   Objective: Temp:  [98.1 F (36.7 C)-98.4 F (36.9 C)] 98.4  F (36.9 C) (03/30 0505) Pulse Rate:  [68-92] 68 (03/30 0505) Resp:  [16-18] 16 (03/30 0505) BP: (94-115)/(56-74) 94/56 (03/30 0505) SpO2:  [96 %-100 %] 100 % (03/30 0505) Weight:  [132 lb 11.2 oz (60.2 kg)-142 lb 13.7 oz (64.8 kg)] 132 lb 11.2 oz (60.2 kg) (03/29 2211) Physical Exam: General: sitting up in bed eating breakfast, in NAD  Cardiovascular: RRR, no murmurs or rubs noted  Respiratory:  CTAB, no increased WOB Abdomen: BS+, NTND  Extremities: moving all extremities  Neuro: A&Ox3 Psych: appropriate mood and affect  Laboratory: Recent Labs  Lab 04/26/17 1110 04/30/17 1659 05/01/17 0707  WBC 6.8 9.9 10.7*  HGB 7.5* 8.1* 7.3*  HCT 22.7* 25.2* 21.9*  PLT 569* 560* 494*   Recent Labs  Lab 04/26/17 1110 04/30/17 1659 05/01/17 0707  NA 140 140 139  K 3.5 3.4* 3.3*  CL 109 105 108  CO2 22 22 24   BUN <5* <5* <5*  CREATININE 0.53 0.54 0.50  CALCIUM 9.1 9.5 8.7*  PROT 6.9 7.3  --   BILITOT 1.3* 1.1  --   ALKPHOS 36* 43  --   ALT 13* 13*  --   AST 28 30  --   GLUCOSE 93 102* 115*    Verner Mould, MD 05/02/2017, 9:34 AM FPTS Intern pager: [phone], text pages welcome

## 2017-05-02 NOTE — Progress Notes (Signed)
Patient discharged to home. After visit Summary reviewed. Patient capable of reverbalizing medications and follow up visits. No signs and symptoms of distress noted. Patient educated to return to the ED in the case of an emergency.Wendy [name] [name]  

## 2017-05-02 NOTE — Discharge Summary (Signed)
[facility] Discharge Summary  Patient name: Wendy [name] Medical record number: [id number] Date of birth: April 05, 1990 Age: 27 y.o. Gender: female Date of Admission: 04/30/2017  Date of Discharge: 05/02/17 Admitting Physician: Lind Covert, MD  Primary Care Provider: Patient, No Pcp Per Consultants: None  Indication for Hospitalization: Sickle cell pain crisis  Discharge Diagnoses/Problem List:  Patient Active Problem List   Diagnosis Date Noted  . Pain crisis 04/30/2017  . Sickle cell pain crisis ([place]) 05/27/2016  . Leukocytosis 05/27/2016  . Sickle cell anemia (HCC)    Disposition: Home  Discharge Condition: Stable  Discharge Exam: See progress note from today's date.   Brief Hospital Course:  Patient presented with 4d of diffuse body pain. Recently moved from [place], and had not established care with a PCP or sickle cell care provider in [place]. Reported multiple short courses of narcotics in past for sickle cell pain, as well as distant history of hydroxyurea use.  In ED, patient denied chest pain or shortness of breath. Was afebrile with no signs of infection. As such, was not worked up for acute chest, and was instead admitted for pain crisis alone. She was started on PCA as well as IV Toradol.  She quickly improved, and was transitioned to PO med regimen. Pain continued to improve, and she was deemed stable for discharge home.   Issues for Follow Up:  1. Discharged on following pain med regimen: Tylenol 650mg  scheduled q6, naproxen 500mg  scheduled BID, oxycodone 15mg  q6 PRN severe pain (prescription for #20 tabs with 0 refills printed). Explained that she would only receive refill for 5d of narcotics, and decision for long-term pain control would be made at [facility] appt.  2. Patient wishing to have a follow-up appt scheduled at [facility] since sickle cell clinic appt not until 02/18. This appt is not meant to establish care, as  sickle cell clinic should become patient's new PCP. 3. Patient reporting prior use of hydroxyurea. As she has not been taking this, was not started during admission or at discharge. Would be most appropriate for sickle cell clinic provider to determine if she should resume this.    Significant Procedures: None  Significant Labs and Imaging:  Recent Labs  Lab 04/26/17 1110 04/30/17 1659 05/01/17 0707  WBC 6.8 9.9 10.7*  HGB 7.5* 8.1* 7.3*  HCT 22.7* 25.2* 21.9*  PLT 569* 560* 494*   Recent Labs  Lab 04/26/17 1110 04/30/17 1659 05/01/17 0707  NA 140 140 139  K 3.5 3.4* 3.3*  CL 109 105 108  CO2 22 22 24   GLUCOSE 93 102* 115*  BUN <5* <5* <5*  CREATININE 0.53 0.54 0.50  CALCIUM 9.1 9.5 8.7*  MG  --   --  1.8  ALKPHOS 36* 43  --   AST 28 30  --   ALT 13* 13*  --   ALBUMIN 4.4 4.8  --     Results/Tests Pending at Time of Discharge: None  Discharge Medications:  Allergies as of 05/02/2017   No Known Allergies     Medication List    TAKE these medications   acetaminophen 325 MG tablet Commonly known as:  TYLENOL Take 2 tablets (650 mg total) by mouth every 6 (six) hours.   naproxen 500 MG tablet Commonly known as:  NAPROSYN Take 1 tablet (500 mg total) by mouth 2 (two) times daily with a meal.   oxyCODONE 15 MG immediate release tablet Commonly known as:  ROXICODONE Take  1 tablet (15 mg total) by mouth every 6 (six) hours as needed.       Discharge Instructions: Please refer to Patient Instructions section of EMR for full details.  Patient was counseled important signs and symptoms that should prompt return to medical care, changes in medications, dietary instructions, activity restrictions, and follow up appointments.   Follow-Up Appointments: Follow-up [facility]. Go on 05/21/2017.   Specialty:  Internal Medicine Why:  Arrive at 1:20 pm- to establish PCP at [place] information: [place] [address] [place], [place], DO. Go on 05/05/2017.   Specialty:  Family Medicine Why:  At 2:00 PM for hospital follow-up Contact information: [address] [place] [address] [phone]           Verner Mould, MD 05/02/2017, 12:14 PM PGY-3, [place]

## 2017-05-05 ENCOUNTER — Ambulatory Visit: Payer: Medicare Other | Admitting: Family Medicine

## 2017-05-08 ENCOUNTER — Ambulatory Visit (INDEPENDENT_AMBULATORY_CARE_PROVIDER_SITE_OTHER): Payer: Medicare Other | Admitting: Family Medicine

## 2017-05-08 ENCOUNTER — Other Ambulatory Visit: Payer: Self-pay

## 2017-05-08 ENCOUNTER — Encounter: Payer: Self-pay | Admitting: Family Medicine

## 2017-05-08 VITALS — BP 112/72 | HR 79 | Temp 98.6°F | Ht 63.0 in | Wt 130.8 lb

## 2017-05-08 DIAGNOSIS — D571 Sickle-cell disease without crisis: Secondary | ICD-10-CM

## 2017-05-08 DIAGNOSIS — D5700 Hb-SS disease with crisis, unspecified: Secondary | ICD-10-CM | POA: Diagnosis present

## 2017-05-08 MED ORDER — OXYCODONE HCL 15 MG PO TABS: 15.0000 mg | ORAL_TABLET | Freq: Two times a day (BID) | ORAL | 0 refills | Status: DC | PRN

## 2017-05-08 NOTE — Patient Instructions (Signed)
It was a pleasure to see you today! Thank you for choosing [facility] for your primary care. [name] was seen for SS pain crisis followup. Come back to the clinic if you have any new concerns prior to establishing with the [facility] on 4/18, and go to the emergency room if you have symptoms of pain crisis or trouble breathing.  Today your physical exam went well and was reassuring.  We are glad you are feeling good.  Keep your appt with the SS clinic on 4/18.  We've also given you 10 more oxy pills to manage pain until that date since you are generally feeling well.  It was good seeing you again, thanks for coming and we wish you well    If we did any lab work today, and the results require attention, either me or my nurse will get in touch with you. If everything is normal, you will get a letter in mail and a message via . If you don't hear from [place] in two weeks, please give [place] a call. Otherwise, we look forward to seeing you again at your next visit. If you have any questions or concerns before then, please call the clinic at [phone].  Please bring all your medications to every doctors visit  Sign up for My Chart to have easy access to your labs results, and communication with your Primary care physician.    Please check-out at the front desk before leaving the clinic.    Best,  Dr. Sherene Sires FAMILY MEDICINE RESIDENT - PGY1 05/08/2017 10:39 AM

## 2017-05-08 NOTE — Progress Notes (Signed)
    Subjective:  Wendy [name] is a 27 y.o. female who presents to the [facility] today with a chief complaint of hospital followup from SS pain crisis.   Patient was seen in hospital for SS pain crisis.  She did not have pcp in area as she recently moved here from [place].  Pain has been well controlled since D/c with only using oxy every other day.  She has been breathing well with no febrile symptoms and no physical complaints.   She is going to establish with SS clinic on 4/18  Objective:  Physical Exam: BP 112/72   Pulse 79   Temp 98.6 F (37 C) (Oral)   Ht 5\' 3"  (1.6 m)   Wt 130 lb 12.8 oz (59.3 kg)   LMP 04/25/2017 Comment: tubal ligation  SpO2 99%   BMI 23.17 kg/m   Gen: NAD, conversing comfortably CV: RRR with no murmurs appreciated Pulm: NWOB, CTAB with no crackles, wheezes, or rhonchi GI:  Soft, Nontender, Nondistended. MSK: no edema, cyanosis, or clubbing noted Skin: warm, dry Neuro: grossly normal, moves all extremities Psych: flat affect and thought content  No results found for this or any previous visit (from the past 72 hour(s)).   Assessment/Plan:  Sickle cell anemia (HCC) No longer in pain crisis, this is a hospital followup for pain crisis.  Respirations good, afebrile, only using oxy every few days since d/c.  Encouraged keeping 4/18 appt with SS clinic.  Discussed that they would managing narcotics/hydroxyurea as appropriate.  Rx'd 10pill oxy to cover current pain needs until 4/18   Sherene Sires, [place] - PGY1 05/08/2017 11:43 AM

## 2017-05-08 NOTE — Assessment & Plan Note (Signed)
No longer in pain crisis, this is a hospital followup for pain crisis.  Respirations good, afebrile, only using oxy every few days since d/c.  Encouraged keeping 4/18 appt with SS clinic.  Discussed that they would managing narcotics/hydroxyurea as appropriate.  Rx'd 10pill oxy to cover current pain needs until 4/18

## 2017-05-12 ENCOUNTER — Encounter (HOSPITAL_COMMUNITY): Payer: Self-pay | Admitting: Emergency Medicine

## 2017-05-12 ENCOUNTER — Emergency Department (HOSPITAL_COMMUNITY): Payer: Medicare Other

## 2017-05-12 ENCOUNTER — Inpatient Hospital Stay (HOSPITAL_COMMUNITY)
Admission: EM | Admit: 2017-05-12 | Discharge: 2017-05-18 | DRG: 812 | Disposition: A | Discharge status: Home / Self Care | Payer: Medicare Other | Attending: Internal Medicine | Admitting: Internal Medicine

## 2017-05-12 DIAGNOSIS — E876 Hypokalemia: Secondary | ICD-10-CM | POA: Diagnosis not present

## 2017-05-12 DIAGNOSIS — D638 Anemia in other chronic diseases classified elsewhere: Secondary | ICD-10-CM | POA: Diagnosis present

## 2017-05-12 DIAGNOSIS — Z791 Long term (current) use of non-steroidal anti-inflammatories (NSAID): Secondary | ICD-10-CM

## 2017-05-12 DIAGNOSIS — Z832 Family history of diseases of the blood and blood-forming organs and certain disorders involving the immune mechanism: Secondary | ICD-10-CM

## 2017-05-12 DIAGNOSIS — D72829 Elevated white blood cell count, unspecified: Secondary | ICD-10-CM | POA: Diagnosis present

## 2017-05-12 DIAGNOSIS — F329 Major depressive disorder, single episode, unspecified: Secondary | ICD-10-CM | POA: Diagnosis present

## 2017-05-12 DIAGNOSIS — D5700 Hb-SS disease with crisis, unspecified: Secondary | ICD-10-CM | POA: Diagnosis present

## 2017-05-12 DIAGNOSIS — D571 Sickle-cell disease without crisis: Secondary | ICD-10-CM | POA: Diagnosis present

## 2017-05-12 DIAGNOSIS — F1120 Opioid dependence, uncomplicated: Secondary | ICD-10-CM | POA: Diagnosis present

## 2017-05-12 LAB — CBC WITH DIFFERENTIAL/PLATELET
Basophils Absolute: 0 10*3/uL (ref 0.0–0.1)
Basophils Relative: 0 %
Eosinophils Absolute: 0.1 10*3/uL (ref 0.0–0.7)
Eosinophils Relative: 1 %
HEMATOCRIT: 24.4 % — AB (ref 36.0–46.0)
Hemoglobin: 8.1 g/dL — ABNORMAL LOW (ref 12.0–15.0)
LYMPHS ABS: 2.5 10*3/uL (ref 0.7–4.0)
LYMPHS PCT: 26 %
MCH: 28.2 pg (ref 26.0–34.0)
MCHC: 33.2 g/dL (ref 30.0–36.0)
MCV: 85 fL (ref 78.0–100.0)
MONO ABS: 0.8 10*3/uL (ref 0.1–1.0)
MONOS PCT: 8 %
NEUTROS ABS: 5.9 10*3/uL (ref 1.7–7.7)
NEUTROS PCT: 65 %
Platelets: 446 10*3/uL — ABNORMAL HIGH (ref 150–400)
RBC: 2.87 MIL/uL — ABNORMAL LOW (ref 3.87–5.11)
RDW: 19.8 % — AB (ref 11.5–15.5)
WBC: 9.4 10*3/uL (ref 4.0–10.5)

## 2017-05-12 LAB — COMPREHENSIVE METABOLIC PANEL
ALBUMIN: 5.2 g/dL — AB (ref 3.5–5.0)
ALK PHOS: 40 U/L (ref 38–126)
ALT: 19 U/L (ref 14–54)
ANION GAP: 9 (ref 5–15)
AST: 29 U/L (ref 15–41)
BUN: 7 mg/dL (ref 6–20)
CALCIUM: 9.3 mg/dL (ref 8.9–10.3)
CHLORIDE: 107 mmol/L (ref 101–111)
CO2: 23 mmol/L (ref 22–32)
Creatinine, Ser: 0.47 mg/dL (ref 0.44–1.00)
GFR calc Af Amer: >60 mL/min (ref >60)
GFR calc non Af Amer: >60 mL/min (ref >60)
GLUCOSE: 120 mg/dL — AB (ref 65–99)
POTASSIUM: 3.6 mmol/L (ref 3.5–5.1)
SODIUM: 139 mmol/L (ref 135–145)
Total Bilirubin: 2.1 mg/dL — ABNORMAL HIGH (ref 0.3–1.2)
Total Protein: 8.1 g/dL (ref 6.5–8.1)

## 2017-05-12 LAB — RETICULOCYTES
RBC.: 2.87 MIL/uL — ABNORMAL LOW (ref 3.87–5.11)
Retic Count, Absolute: 330.1 10*3/uL — ABNORMAL HIGH (ref 19.0–186.0)
Retic Ct Pct: 11.5 % — ABNORMAL HIGH (ref 0.4–3.1)

## 2017-05-12 LAB — I-STAT BETA HCG BLOOD, ED (MC, WL, AP ONLY): I-stat hCG, quantitative: <5 m[IU]/mL (ref <5)

## 2017-05-12 MED ORDER — KETOROLAC TROMETHAMINE 30 MG/ML IJ SOLN
30.0000 mg | INTRAMUSCULAR | Status: AC
  Administered 2017-05-12: 30 mg via INTRAVENOUS
  Filled 2017-05-12: qty 1

## 2017-05-12 MED ORDER — DIPHENHYDRAMINE HCL 25 MG PO CAPS
50.0000 mg | ORAL_CAPSULE | Freq: Once | ORAL | Status: AC
  Administered 2017-05-12: 50 mg via ORAL
  Filled 2017-05-12: qty 2

## 2017-05-12 MED ORDER — HYDROMORPHONE HCL 2 MG/ML IJ SOLN: 2.0000 mg | INTRAMUSCULAR | Status: AC

## 2017-05-12 MED ORDER — HYDROMORPHONE HCL 2 MG/ML IJ SOLN
2.0000 mg | INTRAMUSCULAR | Status: AC
  Administered 2017-05-12: 2 mg via INTRAVENOUS
  Filled 2017-05-12: qty 1

## 2017-05-12 MED ORDER — SODIUM CHLORIDE 0.45 % IV SOLN
INTRAVENOUS | Status: DC
  Administered 2017-05-12: 21:00:00 via INTRAVENOUS

## 2017-05-12 NOTE — ED Notes (Signed)
Pt called to have EKG done with no response.  RN notified.

## 2017-05-12 NOTE — H&P (Signed)
[facility]                                                                                    Patient Demographics  Wendy [name], is a 27 y.o. female  CSN: [id number]  MRN: [id number]  DOB - [birth year]  Admit Date - 05/12/2017  Outpatient Primary MD for the patient is Sherene Sires, DO   With History of -  Past Medical History:  Diagnosis Date  . Anemia   . Sickle cell anemia (HCC)   . Tobacco abuse       Past Surgical History:  Procedure Laterality Date  . CESAREAN SECTION    . CESAREAN SECTION WITH BILATERAL TUBAL LIGATION      in for   Chief Complaint  Patient presents with  . Sickle Cell Pain Crisis     HPI  Wendy [name]  is a 27 y.o. female, with past medical history significant for sickle cell anemia with multiple admissions presenting today with the usual sickle cell crisis pain. This started yesterday and continued all day today. Patient had an upper respiratory infection few days ago which resolved by yesterday  history of nausea vomiting or diarrheaNo chest pains fever or chills or diarrhea.    Review of Systems    In addition to the HPI above,  No Fever-chills, No changes with Vision or hearing, No problems swallowing food or Liquids, No Chest pain, Cough or Shortness of Breath, No Abdominal pain, No Nausea or Vommitting, Bowel movements are regular, No Blood in stool or Urine, No dysuria, No new skin rashes or bruises, No new weakness, tingling, numbness in any extremity, No recent weight gain or loss, No polyuria, polydypsia or polyphagia, No significant Mental Stressors.  A full 10 point Review of Systems was done, except as stated above, all other Review of Systems were negative.   Social History Social History   Tobacco Use  . Smoking status: Never Smoker  . Smokeless tobacco: Never Used  Substance Use Topics  . Alcohol use: No     Family History Family History  Problem Relation Age of Onset  . Sickle cell  trait Father      Prior to Admission medications   Medication Sig Start Date End Date Taking? Authorizing Provider  acetaminophen (TYLENOL) 325 MG tablet Take 2 tablets (650 mg total) by mouth every 6 (six) hours. 05/02/17  Yes Verner Mould, MD  naproxen (NAPROSYN) 500 MG tablet Take 1 tablet (500 mg total) by mouth 2 (two) times daily with a meal. 05/02/17  Yes Verner Mould, MD  oxyCODONE (ROXICODONE) 15 MG immediate release tablet Take 1 tablet (15 mg total) by mouth 2 (two) times daily as needed for up to 5 days. 05/08/17 05/13/17 Yes Sherene Sires, DO    No Known Allergies  Physical Exam  Vitals  Blood pressure 116/78, pulse 94, temperature 99 F (37.2 C), resp. rate 10, last menstrual period 04/25/2017, SpO2 90 %.   1. General well-developed, well-nourished female in pain  2. Normal affect and insight, Not Suicidal or Homicidal, Awake Alert, Oriented X 3.  3. No F.N deficits,grossly, patient will extremities.  4. Ears  and Eyes appear Normal, Conjunctivae clear, PERRLA. Moist Oral Mucosa.  5. Supple Neck, No JVD, No cervical lymphadenopathy appriciated, No Carotid Bruits.  6. Symmetrical Chest wall movement, Good air movement bilaterally, CTAB.  7. RRR, No Gallops, Rubs or Murmurs, No Parasternal Heave.  8. Positive Bowel Sounds, Abdomen Soft, Non tender, No organomegaly appriciated,No rebound -guarding or rigidity.  9.  No Cyanosis, Normal Skin Turgor, No Skin Rash or Bruise.  10. Good muscle tone,  joints appear normal , no effusions, Normal ROM.    Data Review  CBC Recent Labs  Lab 05/12/17 1701  WBC 9.4  HGB 8.1*  HCT 24.4*  PLT 446*  MCV 85.0  MCH 28.2  MCHC 33.2  RDW 19.8*  LYMPHSABS 2.5  MONOABS 0.8  EOSABS 0.1  BASOSABS 0.0   ------------------------------------------------------------------------------------------------------------------  Chemistries  Recent Labs  Lab 05/12/17 1701  NA 139  K 3.6  CL 107  CO2 23   GLUCOSE 120*  BUN 7  CREATININE 0.47  CALCIUM 9.3  AST 29  ALT 19  ALKPHOS 40  BILITOT 2.1*   ------------------------------------------------------------------------------------------------------------------ estimated creatinine clearance is 87.4 mL/min (by C-G formula based on SCr of 0.47 mg/dL). ------------------------------------------------------------------------------------------------------------------ No results for input(s): TSH, T4TOTAL, T3FREE, THYROIDAB in the last 72 hours.  Invalid input(s): FREET3   Coagulation profile No results for input(s): INR, PROTIME in the last 168 hours. ------------------------------------------------------------------------------------------------------------------- No results for input(s): DDIMER in the last 72 hours. -------------------------------------------------------------------------------------------------------------------  Cardiac Enzymes No results for input(s): CKMB, TROPONINI, MYOGLOBIN in the last 168 hours.  Invalid input(s): CK ------------------------------------------------------------------------------------------------------------------ Invalid input(s): POCBNP   ---------------------------------------------------------------------------------------------------------------  Urinalysis    Component Value Date/Time   COLORURINE YELLOW 04/30/2017 2055   APPEARANCEUR CLEAR 04/30/2017 2055   LABSPEC 1.005 04/30/2017 2055   PHURINE 7.0 04/30/2017 2055   GLUCOSEU NEGATIVE 04/30/2017 2055   HGBUR SMALL (A) 04/30/2017 2055   BILIRUBINUR NEGATIVE 04/30/2017 2055   [place] NEGATIVE 04/30/2017 2055   PROTEINUR NEGATIVE 04/30/2017 2055   NITRITE NEGATIVE 04/30/2017 2055   LEUKOCYTESUR SMALL (A) 04/30/2017 2055    ----------------------------------------------------------------------------------------------------------------   Imaging results:   Dg Chest 2 View  Result Date: 05/12/2017 CLINICAL DATA:  Chest  pain since yesterday, recent flu. History of sickle cell anemia. EXAM: CHEST - 2 VIEW COMPARISON:  None. FINDINGS: Borderline cardiomegaly. Mediastinal silhouette is normal. Mild bronchitic changes. Both lungs are clear. The visualized skeletal structures are unremarkable. Surgical clips in the included right abdomen compatible with cholecystectomy. IMPRESSION: Mild bronchitic changes without focal consolidation. Electronically Signed   By: Elon Alas M.D.   On: 05/12/2017 16:30    My personal review of EKG: Rhythm NSR, 93 bpm, no acute changes    Assessment & Plan  Sickle cell crisis ,opioid tolerant  Plan  Admit to MedSurg PCA/ Dilaudid IV fluids      DVT Prophylaxis Lovenox  AM Labs Ordered, also please review Full Orders    Code Status full  Disposition Plan: home  Time spent in minutes : 32 minutes  Condition GUARDED   @SIGNATURE @

## 2017-05-12 NOTE — ED Notes (Addendum)
Pt became upset when told she would have to wait to be moved into a room in order to get the EKG done.  Pt states she would rather wait in the lobby than at the nurses station because there were no rooms currently open.  Pt sts she understood but while being pushed to the lobby pt hopped up out the chair and sts she is leaving.  RN notified and AD as well

## 2017-05-12 NOTE — ED Provider Notes (Signed)
[facility] DEPT Provider Note   CSN: [id number] Arrival date & time: 05/12/17  1555     History   Chief Complaint Chief Complaint  Patient presents with  . Sickle Cell Pain Crisis    HPI Wendy [name] is a 27 y.o. female presenting for evaluation of sickle cell pain.  Patient states that yesterday she started to have generalized pain.  This is consistent with her sickle cell pain crises.  She has taken Tylenol, ibuprofen, and her at home oxycodone without improvement of symptoms.  It is not worse in one particular spot, and is just all over.  It is preventing her from sleeping.  She denies fevers, chills, shortness of breath, cough, nausea, vomiting, urinary sxs, or abnormal bowel movements.  She states she recently had flulike symptoms, which improved yesterday prior to her sickle cell pain beginning.  She has an appointment to establish care with the sickle cell pain clinic on 4/18.   HPI  Past Medical History:  Diagnosis Date  . Anemia   . Sickle cell anemia (HCC)   . Tobacco abuse     Patient Active Problem List   Diagnosis Date Noted  . Leukocytosis 05/27/2016  . Sickle cell anemia (HCC)     Past Surgical History:  Procedure Laterality Date  . CESAREAN SECTION    . CESAREAN SECTION WITH BILATERAL TUBAL LIGATION       OB History   None      Home Medications    Prior to Admission medications   Medication Sig Start Date End Date Taking? Authorizing Provider  acetaminophen (TYLENOL) 325 MG tablet Take 2 tablets (650 mg total) by mouth every 6 (six) hours. 05/02/17  Yes Verner Mould, MD  naproxen (NAPROSYN) 500 MG tablet Take 1 tablet (500 mg total) by mouth 2 (two) times daily with a meal. 05/02/17  Yes Verner Mould, MD  oxyCODONE (ROXICODONE) 15 MG immediate release tablet Take 1 tablet (15 mg total) by mouth 2 (two) times daily as needed for up to 5 days. 05/08/17 05/13/17 Yes Sherene Sires, DO    Family  History Family History  Problem Relation Age of Onset  . Sickle cell trait Father     Social History Social History   Tobacco Use  . Smoking status: Never Smoker  . Smokeless tobacco: Never Used  Substance Use Topics  . Alcohol use: No  . Drug use: No     Allergies   Patient has no known allergies.   Review of Systems Review of Systems  Musculoskeletal: Positive for myalgias.  All other systems reviewed and are negative.    Physical Exam Updated Vital Signs BP 116/78   Pulse 94   Temp 99 F (37.2 C)   Resp 10   LMP 04/25/2017 Comment: tubal ligation  SpO2 90%   Physical Exam  Constitutional: She is oriented to person, place, and time. She appears well-developed and well-nourished. No distress.  HENT:  Head: Normocephalic and atraumatic.  Eyes: Pupils are equal, round, and reactive to light. Conjunctivae and EOM are normal.  Neck: Normal range of motion.  Cardiovascular: Normal rate, regular rhythm and intact distal pulses.  Pulmonary/Chest: Effort normal and breath sounds normal. No respiratory distress. She has no wheezes.  Abdominal: Soft. She exhibits no distension. There is no tenderness. There is no guarding.  Musculoskeletal: Normal range of motion.  No leg swelling, warmth or edema.   Neurological: She is alert and oriented to person, place, and  time.  Skin: Skin is warm. No rash noted.  Psychiatric: She has a normal mood and affect.  Nursing note and vitals reviewed.    ED Treatments / Results  Labs (all labs ordered are listed, but only abnormal results are displayed) Labs Reviewed  COMPREHENSIVE METABOLIC PANEL - Abnormal; Notable for the following components:      Result Value   Glucose, Bld 120 (*)    Albumin 5.2 (*)    Total Bilirubin 2.1 (*)    All other components within normal limits  CBC WITH DIFFERENTIAL/PLATELET - Abnormal; Notable for the following components:   RBC 2.87 (*)    Hemoglobin 8.1 (*)    HCT 24.4 (*)    RDW 19.8  (*)    Platelets 446 (*)    All other components within normal limits  RETICULOCYTES - Abnormal; Notable for the following components:   Retic Ct Pct 11.5 (*)    RBC. 2.87 (*)    Retic Count, Absolute 330.1 (*)    All other components within normal limits  I-STAT BETA HCG BLOOD, ED ([facility], [facility], [facility] ONLY)    EKG EKG Interpretation  Date/Time:  Tuesday May 12 2017 17:19:26 EDT Ventricular Rate:  93 PR Interval:  202 QRS Duration: 90 QT Interval:  350 QTC Calculation: 435 R Axis:   73 Text Interpretation:  Normal sinus rhythm Normal ECG When comapred to prior, no significant changes seen.  No STEMI Confirmed by Antony Blackbird [phone]) on 05/12/2017 9:17:27 PM   Radiology Dg Chest 2 View  Result Date: 05/12/2017 CLINICAL DATA:  Chest pain since yesterday, recent flu. History of sickle cell anemia. EXAM: CHEST - 2 VIEW COMPARISON:  None. FINDINGS: Borderline cardiomegaly. Mediastinal silhouette is normal. Mild bronchitic changes. Both lungs are clear. The visualized skeletal structures are unremarkable. Surgical clips in the included right abdomen compatible with cholecystectomy. IMPRESSION: Mild bronchitic changes without focal consolidation. Electronically Signed   By: Elon Alas M.D.   On: 05/12/2017 16:30    Procedures Procedures (including critical care time)  Medications Ordered in ED Medications  0.45 % sodium chloride infusion ( Intravenous New Bag/Given 05/12/17 2108)  ketorolac (TORADOL) 30 MG/ML injection 30 mg (30 mg Intravenous Given 05/12/17 2109)  HYDROmorphone (DILAUDID) injection 2 mg (2 mg Intravenous Given 05/12/17 2117)    Or  HYDROmorphone (DILAUDID) injection 2 mg ( Subcutaneous See Alternative 05/12/17 2117)  HYDROmorphone (DILAUDID) injection 2 mg (2 mg Intravenous Given 05/12/17 2203)    Or  HYDROmorphone (DILAUDID) injection 2 mg ( Subcutaneous See Alternative 05/12/17 2203)  HYDROmorphone (DILAUDID) injection 2 mg (2 mg Intravenous Given 05/12/17 2234)    Or    HYDROmorphone (DILAUDID) injection 2 mg ( Subcutaneous See Alternative 05/12/17 2234)  diphenhydrAMINE (BENADRYL) capsule 50 mg (50 mg Oral Given 05/12/17 2234)     Initial Impression / Assessment and Plan / ED Course  I have reviewed the triage vital signs and the nursing notes.  Pertinent labs & imaging results that were available during my care of the patient were reviewed by me and considered in my medical decision making (see chart for details).     Patient presenting for evaluation of sickle cell pain.  Physical exam shows patient who is afebrile not tachycardic.  She appears in no distress.  She is not hypoxic.  Generalized pain.  Labs without leukocytosis.  Hemoglobin stable.  Bili slightly elevated, no change in LFTs. No sxs c/w gallbladder pathology. Likely 2/2 sickle cell crisis. Protocol started. EKG without  concerning signs, CXR viewed and interpreted by me, no signs of PNA, shows recent viral changes. At this time, doubt acute chest, clot, infection.  On reassessment, pt reports pain has not improved at all with the 3 doses of dilaudid. Will call for admission.   Discussed with hospitalist, pt to be admitted.   Final Clinical Impressions(s) / ED Diagnoses   Final diagnoses:  Sickle cell pain crisis [facility])    ED Discharge Orders    None       Franchot Heidelberg, PA-C 05/12/17 2344    Tegeler, Gwenyth Allegra, MD 05/13/17 [phone]

## 2017-05-12 NOTE — ED Triage Notes (Signed)
Per EMS-states sickle cell pain that started yesterday-pain in chest and all over-patient is texting on phone-does not appear to be in any distress at this time-patient just got over the flu a couple of days ago

## 2017-05-13 ENCOUNTER — Other Ambulatory Visit: Payer: Self-pay

## 2017-05-13 LAB — CBC
HEMATOCRIT: 23.2 % — AB (ref 36.0–46.0)
Hemoglobin: 7.8 g/dL — ABNORMAL LOW (ref 12.0–15.0)
MCH: 28.2 pg (ref 26.0–34.0)
MCHC: 33.6 g/dL (ref 30.0–36.0)
MCV: 83.8 fL (ref 78.0–100.0)
Platelets: 408 10*3/uL — ABNORMAL HIGH (ref 150–400)
RBC: 2.77 MIL/uL — AB (ref 3.87–5.11)
RDW: 19.3 % — AB (ref 11.5–15.5)
WBC: 11.5 10*3/uL — AB (ref 4.0–10.5)

## 2017-05-13 LAB — CREATININE, SERUM
Creatinine, Ser: 0.41 mg/dL — ABNORMAL LOW (ref 0.44–1.00)
GFR calc Af Amer: >60 mL/min (ref >60)
GFR calc non Af Amer: >60 mL/min (ref >60)

## 2017-05-13 MED ORDER — ENOXAPARIN SODIUM 40 MG/0.4ML ~~LOC~~ SOLN
40.0000 mg | Freq: Every day | SUBCUTANEOUS | Status: DC
  Filled 2017-05-13 (×3): qty 0.4

## 2017-05-13 MED ORDER — NALOXONE HCL 0.4 MG/ML IJ SOLN
0.4000 mg | INTRAMUSCULAR | Status: DC | PRN
Start: 2017-05-13 — End: 2017-05-13

## 2017-05-13 MED ORDER — SODIUM CHLORIDE 0.9 % IV SOLN
25.0000 mg | INTRAVENOUS | Status: DC | PRN
  Filled 2017-05-13: qty 0.5

## 2017-05-13 MED ORDER — DIPHENHYDRAMINE HCL 25 MG PO CAPS: 25.0000 mg | ORAL_CAPSULE | ORAL | Status: DC | PRN

## 2017-05-13 MED ORDER — ONDANSETRON HCL 4 MG/2ML IJ SOLN: 4.0000 mg | Freq: Four times a day (QID) | INTRAMUSCULAR | Status: DC | PRN

## 2017-05-13 MED ORDER — SODIUM CHLORIDE 0.9 % IV SOLN
INTRAVENOUS | Status: DC
  Administered 2017-05-13: 02:00:00 via INTRAVENOUS

## 2017-05-13 MED ORDER — HYDROMORPHONE 1 MG/ML IV SOLN
INTRAVENOUS | Status: DC
  Administered 2017-05-13: 2.5 mg via INTRAVENOUS
  Administered 2017-05-13: 3.5 mg via INTRAVENOUS
  Administered 2017-05-13 (×2): 2 mg via INTRAVENOUS
  Administered 2017-05-13: 02:00:00 via INTRAVENOUS
  Administered 2017-05-13: 1 mg via INTRAVENOUS
  Administered 2017-05-14: 10:00:00 via INTRAVENOUS
  Administered 2017-05-14 (×2): 2 mg via INTRAVENOUS
  Administered 2017-05-14 (×2): 3.5 mg via INTRAVENOUS
  Administered 2017-05-14: 2.79 mg via INTRAVENOUS
  Administered 2017-05-14: 5.5 mg via INTRAVENOUS
  Administered 2017-05-15: 3 mg via INTRAVENOUS
  Administered 2017-05-15: 3.5 mg via INTRAVENOUS
  Administered 2017-05-15: 14:00:00 via INTRAVENOUS
  Administered 2017-05-15: 2.5 mg via INTRAVENOUS
  Administered 2017-05-15: 3 mg via INTRAVENOUS
  Administered 2017-05-15: 5 mg via INTRAVENOUS
  Administered 2017-05-15: 4.5 mg via INTRAVENOUS
  Administered 2017-05-15: 3 mg via INTRAVENOUS
  Administered 2017-05-16: 2.5 mg via INTRAVENOUS
  Administered 2017-05-16 (×2): 4 mg via INTRAVENOUS
  Administered 2017-05-16: 4.5 mg via INTRAVENOUS
  Administered 2017-05-16: 20:00:00 via INTRAVENOUS
  Administered 2017-05-16: 1.5 mg via INTRAVENOUS
  Administered 2017-05-16: 2 mg via INTRAVENOUS
  Administered 2017-05-17: 5 mg via INTRAVENOUS
  Administered 2017-05-17: 1.5 mg via INTRAVENOUS
  Administered 2017-05-17: 2.5 mg via INTRAVENOUS
  Administered 2017-05-17: 2 mg via INTRAVENOUS
  Administered 2017-05-17: 3 mg via INTRAVENOUS
  Administered 2017-05-17: 3.5 mg via INTRAVENOUS
  Administered 2017-05-18 (×2): 2 mg via INTRAVENOUS
  Filled 2017-05-13 (×4): qty 25

## 2017-05-13 MED ORDER — KETOROLAC TROMETHAMINE 30 MG/ML IJ SOLN
30.0000 mg | Freq: Four times a day (QID) | INTRAMUSCULAR | Status: DC
  Administered 2017-05-13 – 2017-05-18 (×19): 30 mg via INTRAVENOUS
  Filled 2017-05-13 (×20): qty 1

## 2017-05-13 MED ORDER — HYDROMORPHONE 1 MG/ML IV SOLN
INTRAVENOUS | Status: DC
  Filled 2017-05-13: qty 25

## 2017-05-13 MED ORDER — ACETAMINOPHEN 325 MG PO TABS: 650.0000 mg | ORAL_TABLET | Freq: Four times a day (QID) | ORAL | Status: DC | PRN

## 2017-05-13 MED ORDER — ONDANSETRON HCL 4 MG PO TABS: 4.0000 mg | ORAL_TABLET | Freq: Four times a day (QID) | ORAL | Status: DC | PRN

## 2017-05-13 MED ORDER — ZOLPIDEM TARTRATE 5 MG PO TABS: 5.0000 mg | ORAL_TABLET | Freq: Every evening | ORAL | Status: DC | PRN

## 2017-05-13 MED ORDER — SODIUM CHLORIDE 0.9% FLUSH: 9.0000 mL | INTRAVENOUS | Status: DC | PRN

## 2017-05-13 NOTE — Progress Notes (Signed)
Patient ID: Wendy [name], female   DOB: 25-Dec-1990, 27 y.o.   MRN: [id number] Subjective:  Patient was admitted yesterday for sickle cell pain crisis, she is on Dilaudid via PCA. She has no new complaint today, she still has pain in her legs and lower back, rated at 7/10. She denies any swelling, no fever, no chest pain, nausea but no vomiting. No SOB.  Objective:  Vital signs in last 24 hours:  Vitals:   05/13/17 1104 05/13/17 1155 05/13/17 1425 05/13/17 1705  BP: 104/61  117/73   Pulse: 76  83   Resp: 16 10 14 13   Temp: 98.2 F (36.8 C)  97.7 F (36.5 C)   TempSrc: Oral  Oral   SpO2: 100% 100% 100% 100%  Weight:        Intake/Output from previous day:   Intake/Output Summary (Last 24 hours) at 05/13/2017 1849 Last data filed at 05/13/2017 0317 Gross per 24 hour  Intake 1049.17 ml  Output -  Net 1049.17 ml    Physical Exam: General: Alert, awake, oriented x3, in no acute distress.  HEENT: [place]/AT PEERL, EOMI Neck: Trachea midline,  no masses, no thyromegal,y no JVD, no carotid bruit OROPHARYNX:  Moist, No exudate/ erythema/lesions.  Heart: Regular rate and rhythm, without murmurs, rubs, gallops, PMI non-displaced, no heaves or thrills on palpation.  Lungs: Clear to auscultation, no wheezing or rhonchi noted. No increased vocal fremitus resonant to percussion  Abdomen: Soft, nontender, nondistended, positive bowel sounds, no masses no hepatosplenomegaly noted..  Neuro: No focal neurological deficits noted cranial nerves II through XII grossly intact. DTRs 2+ bilaterally upper and lower extremities. Strength 5 out of 5 in bilateral upper and lower extremities. Musculoskeletal: No warm swelling or erythema around joints, no spinal tenderness noted. Psychiatric: Patient alert and oriented x3, good insight and cognition, good recent to remote recall. Lymph node survey: No cervical axillary or inguinal lymphadenopathy noted.  Lab Results:  Basic Metabolic Panel:    Component  Value Date/Time   NA 139 05/12/2017 1701   K 3.6 05/12/2017 1701   CL 107 05/12/2017 1701   CO2 23 05/12/2017 1701   BUN 7 05/12/2017 1701   CREATININE 0.41 (L) 05/13/2017 0425   GLUCOSE 120 (H) 05/12/2017 1701   CALCIUM 9.3 05/12/2017 1701   CBC:    Component Value Date/Time   WBC 11.5 (H) 05/13/2017 0425   HGB 7.8 (L) 05/13/2017 0425   HCT 23.2 (L) 05/13/2017 0425   PLT 408 (H) 05/13/2017 0425   MCV 83.8 05/13/2017 0425   NEUTROABS 5.9 05/12/2017 1701   LYMPHSABS 2.5 05/12/2017 1701   MONOABS 0.8 05/12/2017 1701   EOSABS 0.1 05/12/2017 1701   BASOSABS 0.0 05/12/2017 1701    No results found for this or any previous visit (from the past 240 hour(s)).  Studies/Results: Dg Chest 2 View  Result Date: 05/12/2017 CLINICAL DATA:  Chest pain since yesterday, recent flu. History of sickle cell anemia. EXAM: CHEST - 2 VIEW COMPARISON:  None. FINDINGS: Borderline cardiomegaly. Mediastinal silhouette is normal. Mild bronchitic changes. Both lungs are clear. The visualized skeletal structures are unremarkable. Surgical clips in the included right abdomen compatible with cholecystectomy. IMPRESSION: Mild bronchitic changes without focal consolidation. Electronically Signed   By: Elon Alas M.D.   On: 05/12/2017 16:30    Medications: Scheduled Meds: . enoxaparin (LOVENOX) injection  40 mg Subcutaneous QHS  . HYDROmorphone   Intravenous Q4H   Continuous Infusions: . sodium chloride Stopped (05/13/17 0142)  . sodium  chloride 100 mL/hr at 05/13/17 0201   PRN Meds:.acetaminophen, zolpidem  Assessment/Plan: Active Problems:   Sickle cell crisis ([place])  1. Sickle Cell Crisis: Continue PCA at current dose. Continue Toradol and IVF.  2. Anemia of Chronic Disease: Check LDH and CBC with diff tomorrow.   3. Mild Leukocytosis: No evidence of infection. Likely related to crisis.   Code Status: Full Code Family Communication: N/A Disposition Plan: Not yet ready for  discharge  Harsha Yusko  If 7PM-7AM, please contact night-coverage.  05/13/2017, 6:49 PM  LOS: 1 day

## 2017-05-13 NOTE — ED Notes (Signed)
ED TO INPATIENT HANDOFF REPORT  Name/Age/Gender Wendy [name] 27 y.o. female  Code Status Code Status History    Date Active Date Inactive Code Status Order ID Comments User Context   04/30/2017 2301 05/02/2017 1751 Full Code [id number]  Sherene Sires, DO ED   05/28/2016 0004 06/01/2016 1607 Full Code [id number]  Ivor Costa, MD ED      Home/SNF/Other Home  Chief Complaint Sickle Cell Pain Crisis  Level of Care/Admitting Diagnosis ED Disposition    ED Disposition Condition [place]: [facility] [[id number]]  Level of Care: Med-Surg [16]  Diagnosis: Sickle cell crisis [facility]) [[id number]]  Admitting Physician: Merton Border [[id number]]  Attending Physician: Laren Everts, [place]  Estimated length of stay: past midnight tomorrow  Certification:: I certify this patient will need inpatient services for at least 2 midnights  PT Class (Do Not Modify): Inpatient [101]  PT Acc Code (Do Not Modify): Private [1]       Medical History Past Medical History:  Diagnosis Date  . Anemia   . Sickle cell anemia (HCC)   . Tobacco abuse     Allergies No Known Allergies  IV Location/Drains/Wounds Patient Lines/Drains/Airways Status   Active Line/Drains/Airways    Name:   Placement date:   Placement time:   Site:   Days:   Peripheral IV 05/13/17 Left Hand   05/13/17    0018    Hand   less than 1          Labs/Imaging Results for orders placed or performed during the hospital encounter of 05/12/17 (from the past 48 hour(s))  Comprehensive metabolic panel     Status: Abnormal   Collection Time: 05/12/17  5:01 PM  Result Value Ref Range   Sodium 139 135 - 145 mmol/L   Potassium 3.6 3.5 - 5.1 mmol/L   Chloride 107 101 - 111 mmol/L   CO2 23 22 - 32 mmol/L   Glucose, Bld 120 (H) 65 - 99 mg/dL   BUN 7 6 - 20 mg/dL   Creatinine, Ser 0.47 0.44 - 1.00 mg/dL   Calcium 9.3 8.9 - 10.3 mg/dL   Total Protein 8.1 6.5 - 8.1 g/dL   Albumin 5.2 (H) 3.5 - 5.0 g/dL   AST 29  15 - 41 U/L   ALT 19 14 - 54 U/L   Alkaline Phosphatase 40 38 - 126 U/L   Total Bilirubin 2.1 (H) 0.3 - 1.2 mg/dL   GFR calc non Af Amer >60 >60 mL/min   GFR calc Af Amer >60 >60 mL/min    Comment: (NOTE) The eGFR has been calculated using the CKD EPI equation. This calculation has not been validated in all clinical situations. eGFR's persistently <60 mL/min signify possible Chronic Kidney Disease.    Anion gap 9 5 - 15    Comment: Performed at [facility], [place] [address] [place], [place] [address]  CBC with Differential     Status: Abnormal   Collection Time: 05/12/17  5:01 PM  Result Value Ref Range   WBC 9.4 4.0 - 10.5 K/uL   RBC 2.87 (L) 3.87 - 5.11 MIL/uL   Hemoglobin 8.1 (L) 12.0 - 15.0 g/dL   HCT 24.4 (L) 36.0 - 46.0 %   MCV 85.0 78.0 - 100.0 fL   MCH 28.2 26.0 - 34.0 pg   MCHC 33.2 30.0 - 36.0 g/dL   RDW 19.8 (H) 11.5 - 15.5 %   Platelets 446 (H) 150 -  400 K/uL   Neutrophils Relative % 65 %   Neutro Abs 5.9 1.7 - 7.7 K/uL   Lymphocytes Relative 26 %   Lymphs Abs 2.5 0.7 - 4.0 K/uL   Monocytes Relative 8 %   Monocytes Absolute 0.8 0.1 - 1.0 K/uL   Eosinophils Relative 1 %   Eosinophils Absolute 0.1 0.0 - 0.7 K/uL   Basophils Relative 0 %   Basophils Absolute 0.0 0.0 - 0.1 K/uL    Comment: Performed at [facility], [place] [address], [place], [place] [address]  Reticulocytes     Status: Abnormal   Collection Time: 05/12/17  5:01 PM  Result Value Ref Range   Retic Ct Pct 11.5 (H) 0.4 - 3.1 %   RBC. 2.87 (L) 3.87 - 5.11 MIL/uL   Retic Count, Absolute 330.1 (H) 19.0 - 186.0 K/uL    Comment: Performed at [facility], [place] [address] [place], [place] [address]  I-Stat beta hCG blood, ED     Status: None   Collection Time: 05/12/17  5:04 PM  Result Value Ref Range   I-stat hCG, quantitative <5.0 <5 mIU/mL   Comment 3            Comment:   GEST. AGE      CONC.  (mIU/mL)   <=1 WEEK        5 - 50     2 WEEKS        50 - 500     3 WEEKS       100 - 10,000     4 WEEKS     1,000 - 30,000        FEMALE AND NON-PREGNANT FEMALE:     LESS THAN 5 mIU/mL    Dg Chest 2 View  Result Date: 05/12/2017 CLINICAL DATA:  Chest pain since yesterday, recent flu. History of sickle cell anemia. EXAM: CHEST - 2 VIEW COMPARISON:  None. FINDINGS: Borderline cardiomegaly. Mediastinal silhouette is normal. Mild bronchitic changes. Both lungs are clear. The visualized skeletal structures are unremarkable. Surgical clips in the included right abdomen compatible with cholecystectomy. IMPRESSION: Mild bronchitic changes without focal consolidation. Electronically Signed   By: Elon Alas M.D.   On: 05/12/2017 16:30    Pending Labs [employer] (From admission, onward)   Start     Ordered   Signed and Held  CBC  (enoxaparin (LOVENOX)    CrCl >/= 30 ml/min)  Once,   R    Comments:  Baseline for enoxaparin therapy IF NOT ALREADY DRAWN.  Notify MD if PLT < 100 K.    Signed and Held   Signed and Held  Creatinine, serum  (enoxaparin (LOVENOX)    CrCl >/= 30 ml/min)  Once,   R    Comments:  Baseline for enoxaparin therapy IF NOT ALREADY DRAWN.    Signed and Held   Signed and Held  Creatinine, serum  (enoxaparin (LOVENOX)    CrCl >/= 30 ml/min)  Weekly,   R    Comments:  while on enoxaparin therapy    Signed and Held   Signed and Held  CBC  Tomorrow morning,   R     Signed and Held      Vitals/Pain Today's Vitals   05/12/17 2230 05/12/17 2300 05/12/17 2312 05/12/17 2330  BP: (!) 115/54 116/78  (!) 95/53  Pulse: 93 94  97  Resp: _0 Temp:      SpO2: 96%  90%  94%  PainSc:   8      Isolation Precautions No active isolations  Medications Medications  0.45 % sodium chloride infusion ( Intravenous New Bag/Given 05/12/17 2108)  ketorolac (TORADOL) 30 MG/ML injection 30 mg (30 mg Intravenous Given 05/12/17 2109)  HYDROmorphone (DILAUDID) injection 2 mg (2 mg Intravenous Given 05/12/17 2117)    Or  HYDROmorphone  (DILAUDID) injection 2 mg ( Subcutaneous [name] Alternative 05/12/17 2117)  HYDROmorphone (DILAUDID) injection 2 mg (2 mg Intravenous Given 05/12/17 2203)    Or  HYDROmorphone (DILAUDID) injection 2 mg ( Subcutaneous [name] Alternative 05/12/17 2203)  HYDROmorphone (DILAUDID) injection 2 mg (2 mg Intravenous Given 05/12/17 2234)    Or  HYDROmorphone (DILAUDID) injection 2 mg ( Subcutaneous [name] Alternative 05/12/17 2234)  diphenhydrAMINE (BENADRYL) capsule 50 mg (50 mg Oral Given 05/12/17 2234)    Mobility walks

## 2017-05-13 NOTE — Progress Notes (Signed)
   05/13/17 1500  Clinical Encounter Type  Visited With Patient  Visit Type Follow-up  Referral From Patient;Chaplain  Spiritual Encounters  Spiritual Needs Emotional;Prayer   Following up on this patient per request of the patient and Chaplain.  Patient was quiet, but welcomed the visit.  She appreciated Chaplain Varner asking me to check on her.  She stated she and her mom have a distant relationship, but her cousin provides support.  We prayed together.  Will follow and support as needed. Chaplain Katherene Ponto

## 2017-05-13 NOTE — Progress Notes (Signed)
   05/13/17 1100  Clinical Encounter Type  Visited With Patient  Visit Type Initial;Psychological support;Spiritual support  Referral From Nurse  Consult/Referral To Chaplain  Spiritual Encounters  Spiritual Needs Emotional;Other (Comment) (Spiritual Care Conversation/Support)  Stress Factors  Patient Stress Factors Family relationships;Health changes;Loss;Major life changes  Advance Directives (For Healthcare)  Does Patient Have a Medical Advance Directive? No  Would patient like information on creating a medical advance directive? Yes (Inpatient - patient requests chaplain consult to create a medical advance directive) (Paperwork given to patient )   I visited with the patient per [place] to discuss an Advance Directive. I provided the Advance Directive paperwork to the patient.  The patient is currently dealing with issues around loss, due to her mother raising her 57 year old son. The patient states that her mother will not let her have her son, "because she wants a check." The patient stated that she is dealing with depression over this, but denies any suicidal ideations. The patient requested help getting a counselor. I notified the patient's bedside nurse.  I will follow up with the patient.   Please, contact Spiritual Care for further support.   Chaplain Shanon Ace M.Div., [facility]

## 2017-05-14 DIAGNOSIS — D5700 Hb-SS disease with crisis, unspecified: Principal | ICD-10-CM

## 2017-05-14 LAB — CBC WITH DIFFERENTIAL/PLATELET
Basophils Absolute: 0.1 10*3/uL (ref 0.0–0.1)
Basophils Relative: 1 %
Eosinophils Absolute: 0.4 10*3/uL (ref 0.0–0.7)
Eosinophils Relative: 4 %
HEMATOCRIT: 23.2 % — AB (ref 36.0–46.0)
Hemoglobin: 7.6 g/dL — ABNORMAL LOW (ref 12.0–15.0)
LYMPHS PCT: 38 %
Lymphs Abs: 4.1 10*3/uL — ABNORMAL HIGH (ref 0.7–4.0)
MCH: 27.4 pg (ref 26.0–34.0)
MCHC: 32.8 g/dL (ref 30.0–36.0)
MCV: 83.8 fL (ref 78.0–100.0)
MONOS PCT: 9 %
Monocytes Absolute: 1 10*3/uL (ref 0.1–1.0)
NEUTROS ABS: 5.4 10*3/uL (ref 1.7–7.7)
Neutrophils Relative %: 48 %
Platelets: 410 10*3/uL — ABNORMAL HIGH (ref 150–400)
RBC: 2.77 MIL/uL — ABNORMAL LOW (ref 3.87–5.11)
RDW: 19.3 % — AB (ref 11.5–15.5)
WBC: 10.9 10*3/uL — ABNORMAL HIGH (ref 4.0–10.5)

## 2017-05-14 LAB — COMPREHENSIVE METABOLIC PANEL
ALK PHOS: 36 U/L — AB (ref 38–126)
ALT: 16 U/L (ref 14–54)
AST: 27 U/L (ref 15–41)
Albumin: 4.5 g/dL (ref 3.5–5.0)
Anion gap: 7 (ref 5–15)
BILIRUBIN TOTAL: 2 mg/dL — AB (ref 0.3–1.2)
BUN: 6 mg/dL (ref 6–20)
CALCIUM: 9.3 mg/dL (ref 8.9–10.3)
CHLORIDE: 109 mmol/L (ref 101–111)
CO2: 24 mmol/L (ref 22–32)
CREATININE: 0.49 mg/dL (ref 0.44–1.00)
Glucose, Bld: 111 mg/dL — ABNORMAL HIGH (ref 65–99)
Potassium: 3.6 mmol/L (ref 3.5–5.1)
Sodium: 140 mmol/L (ref 135–145)
TOTAL PROTEIN: 7.4 g/dL (ref 6.5–8.1)

## 2017-05-14 MED ORDER — ONDANSETRON HCL 4 MG/2ML IJ SOLN
4.0000 mg | Freq: Four times a day (QID) | INTRAMUSCULAR | Status: DC | PRN
  Filled 2017-05-14: qty 2

## 2017-05-14 MED ORDER — DIPHENHYDRAMINE HCL 25 MG PO CAPS
25.0000 mg | ORAL_CAPSULE | Freq: Four times a day (QID) | ORAL | Status: DC | PRN
  Administered 2017-05-14 – 2017-05-17 (×8): 25 mg via ORAL
  Filled 2017-05-14 (×8): qty 1

## 2017-05-14 MED ORDER — DEXTROSE-NACL 5-0.45 % IV SOLN
INTRAVENOUS | Status: DC
  Administered 2017-05-14 – 2017-05-17 (×4): via INTRAVENOUS

## 2017-05-14 MED ORDER — ESCITALOPRAM OXALATE 20 MG PO TABS
20.0000 mg | ORAL_TABLET | Freq: Every day | ORAL | Status: DC
  Administered 2017-05-15: 20 mg via ORAL
  Filled 2017-05-14 (×2): qty 1

## 2017-05-14 NOTE — Progress Notes (Signed)
Subjective: A 27 year old female admitted with sickle cell painful crisis. Patient is relatively nave to opiates.She complained of some mild depression today. She's been worried about her daughter in general illness. Patient has not been hospitalized much. Her pain started  2 days ago. Currently 6 out of 10 mostly in her back and arms. She is on Dilaudid PCA and Toradol. She used 17.5 mg with 60 demands and 35 deliveries in the last 24 hours. No nausea vomiting or diarrhea.  Objective: Vital signs in last 24 hours: Temp:  [97.7 F (36.5 C)-98.9 F (37.2 C)] 97.7 F (36.5 C) (04/11 0550) Pulse Rate:  [76-89] 79 (04/11 0550) Resp:  [9-16] 9 (04/11 0550) BP: (104-117)/(61-73) 105/71 (04/11 0550) SpO2:  [95 %-100 %] 100 % (04/11 0550) Weight change:  Last BM Date: 05/13/17  Intake/Output from previous day: No intake/output data recorded. Intake/Output this shift: No intake/output data recorded.  General appearance: alert, cooperative, appears stated age and no distress Head: Normocephalic, without obvious abnormality, atraumatic Neck: no adenopathy, no carotid bruit, no JVD, supple, symmetrical, trachea midline and thyroid not enlarged, symmetric, no tenderness/mass/nodules Back: symmetric, no curvature. ROM normal. No CVA tenderness. Resp: clear to auscultation bilaterally Cardio: regular rate and rhythm, S1, S2 normal, no murmur, click, rub or gallop GI: soft, non-tender; bowel sounds normal; no masses,  no organomegaly Extremities: extremities normal, atraumatic, no cyanosis or edema Pulses: 2+ and symmetric Skin: Skin color, texture, turgor normal. No rashes or lesions Neurologic: Grossly normal  Lab Results: Recent Labs    05/13/17 0425 05/14/17 0424  WBC 11.5* 10.9*  HGB 7.8* 7.6*  HCT 23.2* 23.2*  PLT 408* 410*   BMET Recent Labs    05/12/17 1701 05/13/17 0425 05/14/17 0424  NA 139  --  140  K 3.6  --  3.6  CL 107  --  109  CO2 23  --  24  GLUCOSE 120*  --   111*  BUN 7  --  6  CREATININE 0.47 0.41* 0.49  CALCIUM 9.3  --  9.3    Studies/Results: Dg Chest 2 View  Result Date: 05/12/2017 CLINICAL DATA:  Chest pain since yesterday, recent flu. History of sickle cell anemia. EXAM: CHEST - 2 VIEW COMPARISON:  None. FINDINGS: Borderline cardiomegaly. Mediastinal silhouette is normal. Mild bronchitic changes. Both lungs are clear. The visualized skeletal structures are unremarkable. Surgical clips in the included right abdomen compatible with cholecystectomy. IMPRESSION: Mild bronchitic changes without focal consolidation. Electronically Signed   By: Elon Alas M.D.   On: 05/12/2017 16:30    Medications: I have reviewed the patient's current medications.  Assessment/Plan: A 27 year old female admitted with sickle cell painful crisis.  #1 sickle cell painful crisis: Patient is responding to current treatment with Dilaudid PCA and Toradol. We'll continue this treatment and adjust medications accordingly. Her goal for pain is 4 out of 10.  #2 sickle cell anemia: Hemoglobin has stabilized. It is at 7.6 which is most likely her baseline. Continue close monitoring.  #3 depression: Patient has been dealing with depression but has not been treated. I will start her on empiric Lexapro 20 mg daily.  #4 leukocytosis: Secondary to sickle cell crisis. Slowly improving.    LOS: 2 days   GARBA,LAWAL 05/14/2017, 7:18 AM

## 2017-05-15 LAB — COMPREHENSIVE METABOLIC PANEL
ALK PHOS: 32 U/L — AB (ref 38–126)
ALT: 13 U/L — AB (ref 14–54)
AST: 27 U/L (ref 15–41)
Albumin: 4.3 g/dL (ref 3.5–5.0)
Anion gap: 10 (ref 5–15)
BUN: 5 mg/dL — AB (ref 6–20)
CALCIUM: 9.1 mg/dL (ref 8.9–10.3)
CHLORIDE: 106 mmol/L (ref 101–111)
CO2: 24 mmol/L (ref 22–32)
CREATININE: 0.44 mg/dL (ref 0.44–1.00)
GFR calc Af Amer: >60 mL/min (ref >60)
GFR calc non Af Amer: >60 mL/min (ref >60)
Glucose, Bld: 96 mg/dL (ref 65–99)
Potassium: 3.6 mmol/L (ref 3.5–5.1)
SODIUM: 140 mmol/L (ref 135–145)
Total Bilirubin: 2.4 mg/dL — ABNORMAL HIGH (ref 0.3–1.2)
Total Protein: 6.8 g/dL (ref 6.5–8.1)

## 2017-05-15 LAB — CBC WITH DIFFERENTIAL/PLATELET
BASOS ABS: 0.1 10*3/uL (ref 0.0–0.1)
Basophils Relative: 1 %
EOS ABS: 0.7 10*3/uL (ref 0.0–0.7)
EOS PCT: 6 %
HCT: 19.2 % — ABNORMAL LOW (ref 36.0–46.0)
HEMOGLOBIN: 6.5 g/dL — AB (ref 12.0–15.0)
LYMPHS PCT: 41 %
Lymphs Abs: 4.9 10*3/uL — ABNORMAL HIGH (ref 0.7–4.0)
MCH: 27.5 pg (ref 26.0–34.0)
MCHC: 33.9 g/dL (ref 30.0–36.0)
MCV: 81.4 fL (ref 78.0–100.0)
Monocytes Absolute: 1.2 10*3/uL — ABNORMAL HIGH (ref 0.1–1.0)
Monocytes Relative: 10 %
NEUTROS PCT: 42 %
Neutro Abs: 5.2 10*3/uL (ref 1.7–7.7)
Platelets: 401 10*3/uL — ABNORMAL HIGH (ref 150–400)
RBC: 2.36 MIL/uL — AB (ref 3.87–5.11)
RDW: 20.9 % — ABNORMAL HIGH (ref 11.5–15.5)
WBC: 12.1 10*3/uL — AB (ref 4.0–10.5)

## 2017-05-15 LAB — PREPARE RBC (CROSSMATCH)

## 2017-05-15 NOTE — Progress Notes (Signed)
Subjective: Patient said the pain has gone to 8 out of 10 today. Denies any nausea vomiting or diarrhea. Still on the Dilaudid PCA and she used 21 mg with 72 demands and 35 8 deliveries.  Objective: Vital signs in last 24 hours: Temp:  [97.9 F (36.6 C)-98.2 F (36.8 C)] 97.9 F (36.6 C) (04/12 1000) Pulse Rate:  [85-97] 97 (04/12 1000) Resp:  [9-19] 14 (04/12 1111) BP: (105-120)/(63-80) 120/77 (04/12 1000) SpO2:  [95 %-100 %] 96 % (04/12 1111) Weight:  [59.1 kg (130 lb 4.7 oz)] 59.1 kg (130 lb 4.7 oz) (04/12 0806) Weight change:  Last BM Date: 05/12/17  Intake/Output from previous day: 04/11 0701 - 04/12 0700 In: 1383.3 [P.O.:480; I.V.:903.3] Out: -  Intake/Output this shift: Total I/O In: 393.3 [I.V.:393.3] Out: -   General appearance: alert, cooperative, appears stated age and no distress Head: Normocephalic, without obvious abnormality, atraumatic Neck: no adenopathy, no carotid bruit, no JVD, supple, symmetrical, trachea midline and thyroid not enlarged, symmetric, no tenderness/mass/nodules Back: symmetric, no curvature. ROM normal. No CVA tenderness. Resp: clear to auscultation bilaterally Cardio: regular rate and rhythm, S1, S2 normal, no murmur, click, rub or gallop GI: soft, non-tender; bowel sounds normal; no masses,  no organomegaly Extremities: extremities normal, atraumatic, no cyanosis or edema Pulses: 2+ and symmetric Skin: Skin color, texture, turgor normal. No rashes or lesions Neurologic: Grossly normal  Lab Results: Recent Labs    05/14/17 0424 05/15/17 0356  WBC 10.9* 12.1*  HGB 7.6* 6.5*  HCT 23.2* 19.2*  PLT 410* 401*   BMET Recent Labs    05/14/17 0424 05/15/17 0356  NA 140 140  K 3.6 3.6  CL 109 106  CO2 24 24  GLUCOSE 111* 96  BUN 6 5*  CREATININE 0.49 0.44  CALCIUM 9.3 9.1    Studies/Results: No results found.  Medications: I have reviewed the patient's current medications.  Assessment/Plan: A 27 year old female admitted  with sickle cell painful crisis.  #1 sickle cell painful crisis: Patient is doing better. Still has more pain today and hemoglobin has dropped to 6.5 today. Continue current treatment.   #2 sickle cell anemia: Hemoglobin has dropped to 6.5. Recheck tomorrow and if is dropping we'll transfuse  #3 depression: Continue empiric Lexapro 20 mg daily.  #4 leukocytosis: Secondary to sickle cell crisis. Worsened today. Monitor.   LOS: 3 days   Leisl Spurrier,LAWAL 05/15/2017, 11:53 AM

## 2017-05-15 NOTE — Progress Notes (Addendum)
CRITICAL VALUE ALERT  Critical Value:  Hemoglobin 6.5  Date & Time Notied:  3967, 4/12  Provider Notified: Opyd  Orders Received/Actions taken: Prepare 1 unit PRBC

## 2017-05-15 NOTE — Care Management Important Message (Signed)
Important Message  Patient Details  Name: Terre Hanneman MRN: [id number] Date of Birth: 12-05-1990   Medicare Important Message Given:  Yes    Kerin Salen 05/15/2017, 12:37 [place] Message  Patient Details  Name: Cathlene Gardella MRN: [id number] Date of Birth: 09-26-1990   Medicare Important Message Given:  Yes    Kerin Salen 05/15/2017, 12:37 PM

## 2017-05-16 LAB — COMPREHENSIVE METABOLIC PANEL
ALBUMIN: 4.1 g/dL (ref 3.5–5.0)
ALK PHOS: 32 U/L — AB (ref 38–126)
ALT: 12 U/L — ABNORMAL LOW (ref 14–54)
AST: 26 U/L (ref 15–41)
Anion gap: 9 (ref 5–15)
BUN: <5 mg/dL — ABNORMAL LOW (ref 6–20)
CALCIUM: 9.2 mg/dL (ref 8.9–10.3)
CO2: 25 mmol/L (ref 22–32)
Chloride: 106 mmol/L (ref 101–111)
Creatinine, Ser: 0.4 mg/dL — ABNORMAL LOW (ref 0.44–1.00)
GFR calc non Af Amer: >60 mL/min (ref >60)
GLUCOSE: 97 mg/dL (ref 65–99)
POTASSIUM: 3.4 mmol/L — AB (ref 3.5–5.1)
SODIUM: 140 mmol/L (ref 135–145)
TOTAL PROTEIN: 7.1 g/dL (ref 6.5–8.1)
Total Bilirubin: 1.8 mg/dL — ABNORMAL HIGH (ref 0.3–1.2)

## 2017-05-16 LAB — CBC WITH DIFFERENTIAL/PLATELET
BAND NEUTROPHILS: 0 %
BASOS PCT: 1 %
Basophils Absolute: 0.1 10*3/uL (ref 0.0–0.1)
Blasts: 0 %
EOS PCT: 7 %
Eosinophils Absolute: 0.7 10*3/uL (ref 0.0–0.7)
HCT: 21.2 % — ABNORMAL LOW (ref 36.0–46.0)
HEMOGLOBIN: 7.2 g/dL — AB (ref 12.0–15.0)
LYMPHS ABS: 4.2 10*3/uL — AB (ref 0.7–4.0)
LYMPHS PCT: 41 %
MCH: 28 pg (ref 26.0–34.0)
MCHC: 34 g/dL (ref 30.0–36.0)
MCV: 82.5 fL (ref 78.0–100.0)
MONO ABS: 1 10*3/uL (ref 0.1–1.0)
MONOS PCT: 10 %
Metamyelocytes Relative: 0 %
Myelocytes: 0 %
NEUTROS ABS: 4.2 10*3/uL (ref 1.7–7.7)
NEUTROS PCT: 41 %
NRBC: 0 /100{WBCs}
OTHER: 0 %
Platelets: 418 10*3/uL — ABNORMAL HIGH (ref 150–400)
Promyelocytes Relative: 0 %
RBC: 2.57 MIL/uL — ABNORMAL LOW (ref 3.87–5.11)
RDW: 22 % — ABNORMAL HIGH (ref 11.5–15.5)
WBC: 10.2 10*3/uL (ref 4.0–10.5)

## 2017-05-16 NOTE — Progress Notes (Signed)
Subjective: Patient as improved on her current pain regimen.  She was able to walk around.  She is worried about her child.  Pain is at 6 out of 10 mainly in the legs.  No fever or chills no nausea vomiting or diarrhea.  Objective: Vital signs in last 24 hours: Temp:  [97.8 F (36.6 C)-99.2 F (37.3 C)] 99.2 F (37.3 C) (04/13 1724) Pulse Rate:  [89-99] 95 (04/13 1724) Resp:  [10-23] 14 (04/13 1929) BP: (96-115)/(57-81) 106/71 (04/13 1724) SpO2:  [97 %-100 %] 99 % (04/13 1929) FiO2 (%):  [28 %] 28 % (04/13 1650) Weight change: 0.001 kg () Last BM Date: 05/12/17  Intake/Output from previous day: 04/12 0701 - 04/13 0700 In: 972.7 [I.V.:972.7] Out: -  Intake/Output this shift: No intake/output data recorded.  General appearance: alert, cooperative, appears stated age and no distress Head: Normocephalic, without obvious abnormality, atraumatic Neck: no adenopathy, no carotid bruit, no JVD, supple, symmetrical, trachea midline and thyroid not enlarged, symmetric, no tenderness/mass/nodules Back: symmetric, no curvature. ROM normal. No CVA tenderness. Resp: clear to auscultation bilaterally Cardio: regular rate and rhythm, S1, S2 normal, no murmur, click, rub or gallop GI: soft, non-tender; bowel sounds normal; no masses,  no organomegaly Extremities: extremities normal, atraumatic, no cyanosis or edema Pulses: 2+ and symmetric Skin: Skin color, texture, turgor normal. No rashes or lesions Neurologic: Grossly normal  Lab Results: Recent Labs    05/15/17 0356 05/16/17 0946  WBC 12.1* 10.2  HGB 6.5* 7.2*  HCT 19.2* 21.2*  PLT 401* 418*   BMET Recent Labs    05/15/17 0356 05/16/17 0946  NA 140 140  K 3.6 3.4*  CL 106 106  CO2 24 25  GLUCOSE 96 97  BUN 5* <5*  CREATININE 0.44 0.40*  CALCIUM 9.1 9.2    Studies/Results: No results found.  Medications: I have reviewed the patient's current medications.  Assessment/Plan: A 27 year old female admitted with sickle  cell painful crisis.  #1 sickle cell painful crisis: Patient is still having pain but better Continue current treatment.   #2 sickle cell anemia: Hemoglobin has Improved to 7.2.  No transfusion at this point.  #3 depression: Continue empiric Lexapro 20 mg daily.  #4 leukocytosis: Secondary to sickle cell crisis. This appears resolved.  Today to monitor  #5 hypokalemia: Replete potassium.   LOS: 4 days   GARBA,LAWAL 05/16/2017, 10:39 PM

## 2017-05-17 NOTE — Plan of Care (Signed)
  Problem: Pain Managment: Goal: General experience of comfort will improve Outcome: Not Progressing Pt is not satisfied with the relief from her PCA but doesn't want other prns ordered for pain.

## 2017-05-18 LAB — CBC WITH DIFFERENTIAL/PLATELET
Basophils Absolute: 0 10*3/uL (ref 0.0–0.1)
Basophils Relative: 0 %
EOS ABS: 0.8 10*3/uL — AB (ref 0.0–0.7)
Eosinophils Relative: 7 %
HCT: 20.7 % — ABNORMAL LOW (ref 36.0–46.0)
HEMOGLOBIN: 7 g/dL — AB (ref 12.0–15.0)
LYMPHS ABS: 2.2 10*3/uL (ref 0.7–4.0)
Lymphocytes Relative: 18 %
MCH: 28.2 pg (ref 26.0–34.0)
MCHC: 33.8 g/dL (ref 30.0–36.0)
MCV: 83.5 fL (ref 78.0–100.0)
MONO ABS: 1 10*3/uL (ref 0.1–1.0)
Monocytes Relative: 8 %
NEUTROS ABS: 8 10*3/uL — AB (ref 1.7–7.7)
NRBC: 3 /100{WBCs} — AB
Neutrophils Relative %: 67 %
PLATELETS: 420 10*3/uL — AB (ref 150–400)
RBC: 2.48 MIL/uL — AB (ref 3.87–5.11)
RDW: 22.6 % — ABNORMAL HIGH (ref 11.5–15.5)
WBC: 12 10*3/uL — AB (ref 4.0–10.5)

## 2017-05-18 LAB — COMPREHENSIVE METABOLIC PANEL
ALBUMIN: 3.8 g/dL (ref 3.5–5.0)
ALK PHOS: 26 U/L — AB (ref 38–126)
ALT: 11 U/L — ABNORMAL LOW (ref 14–54)
ANION GAP: 8 (ref 5–15)
AST: 22 U/L (ref 15–41)
BUN: <5 mg/dL — ABNORMAL LOW (ref 6–20)
CALCIUM: 9.1 mg/dL (ref 8.9–10.3)
CO2: 26 mmol/L (ref 22–32)
Chloride: 108 mmol/L (ref 101–111)
Creatinine, Ser: 0.53 mg/dL (ref 0.44–1.00)
GFR calc non Af Amer: >60 mL/min (ref >60)
Glucose, Bld: 99 mg/dL (ref 65–99)
POTASSIUM: 3.2 mmol/L — AB (ref 3.5–5.1)
SODIUM: 142 mmol/L (ref 135–145)
Total Bilirubin: 1.6 mg/dL — ABNORMAL HIGH (ref 0.3–1.2)
Total Protein: 6.5 g/dL (ref 6.5–8.1)

## 2017-05-18 LAB — HEMOGLOBINOPATHY EVALUATION
HGB A2 QUANT: 3.4 % — AB (ref 1.8–3.2)
HGB F QUANT: 5.5 % — AB (ref 0.0–2.0)
HGB S QUANTITAION: 91.1 % — AB
Hgb A: 0 % — ABNORMAL LOW (ref 96.4–98.8)
Hgb C: 0 %
Hgb Variant: 0 %

## 2017-05-18 NOTE — Progress Notes (Signed)
Patient discharged to home, all discharge medications and instructions reviewed and questions answered.  Patient to be assisted to vehicle by wheelchair.  

## 2017-05-18 NOTE — Discharge Summary (Signed)
Physician Discharge Summary  Patient ID: Wendy [name] MRN: [id number] DOB/AGE: 03-31-1990 27 y.o.  Admit date: 05/12/2017 Discharge date: 05/18/2017  Admission Diagnoses:  Discharge Diagnoses:  Principal Problem:   Sickle cell crisis ([place]) Active Problems:   Sickle cell anemia ([facility])   Sickle cell pain crisis ([place])   Leukocytosis   Discharged Condition: good  Hospital Course: patient was admitted with sickle cell painful crisis.  She had significant leukocytosis and anemia on admission with hemoglobin of 7.  She also is depressed but not on any therapy.  She was treated with Dilaudid PCA, Toradol and IV fluids.  She was also started on Lexapro for depression.  Patient had potassium supplementation due to mild hypokalemia at 3.1.  Hi Dilaudid PCA was slowly titrated.  Patient transitioned to oral pain medication at discharge.  She is to follow with her PCP.  Consults: None  Significant Diagnostic Studies: labs: CBCs and CMP sweats checked serially  Treatments: IV hydration, analgesia: acetaminophen and Dilaudid and antidepressants.  Discharge Exam: Blood pressure (!) 100/59, pulse 75, temperature 98.2 F (36.8 C), temperature source Oral, resp. rate 12, height 5\' 3"  (1.6 m), weight 59.1 kg (130 lb 4.7 oz), last menstrual period 04/25/2017, SpO2 100 %. General appearance: alert, cooperative, appears stated age and no distress Head: Normocephalic, without obvious abnormality, atraumatic Neck: no adenopathy, no carotid bruit, no JVD, supple, symmetrical, trachea midline and thyroid not enlarged, symmetric, no tenderness/mass/nodules Back: symmetric, no curvature. ROM normal. No CVA tenderness. Resp: clear to auscultation bilaterally Cardio: regular rate and rhythm, S1, S2 normal, no murmur, click, rub or gallop GI: soft, non-tender; bowel sounds normal; no masses,  no organomegaly Extremities: extremities normal, atraumatic, no cyanosis or edema Pulses: 2+ and symmetric Neurologic:  Grossly normal  Disposition: Discharge disposition: 01-Home or Self Care       Discharge Instructions    Diet - low sodium heart healthy   Complete by:  As directed    Increase activity slowly   Complete by:  As directed      Allergies as of 05/18/2017   No Known Allergies     Medication List    STOP taking these medications   naproxen 500 MG tablet Commonly known as:  NAPROSYN   oxyCODONE 15 MG immediate release tablet Commonly known as:  ROXICODONE     TAKE these medications   acetaminophen 325 MG tablet Commonly known as:  TYLENOL Take 2 tablets (650 mg total) by mouth every 6 (six) hours.        SignedBarbette Merino 05/18/2017, 1:15 PM   Time spent 35 minutes

## 2017-05-19 ENCOUNTER — Telehealth (HOSPITAL_COMMUNITY): Payer: Self-pay | Admitting: *Deleted

## 2017-05-19 ENCOUNTER — Encounter (HOSPITAL_COMMUNITY): Payer: Self-pay | Admitting: Emergency Medicine

## 2017-05-19 ENCOUNTER — Emergency Department (HOSPITAL_COMMUNITY)
Admission: EM | Admit: 2017-05-19 | Discharge: 2017-05-19 | Disposition: A | Discharge status: Home / Self Care | Payer: Medicare Other | Attending: Emergency Medicine | Admitting: Emergency Medicine

## 2017-05-19 ENCOUNTER — Emergency Department (HOSPITAL_COMMUNITY): Payer: Medicare Other

## 2017-05-19 DIAGNOSIS — D57219 Sickle-cell/Hb-C disease with crisis, unspecified: Secondary | ICD-10-CM | POA: Diagnosis present

## 2017-05-19 DIAGNOSIS — D5700 Hb-SS disease with crisis, unspecified: Secondary | ICD-10-CM

## 2017-05-19 LAB — CBC WITH DIFFERENTIAL/PLATELET
Basophils Absolute: 0 10*3/uL (ref 0.0–0.1)
Basophils Relative: 0 %
EOS ABS: 0.1 10*3/uL (ref 0.0–0.7)
EOS PCT: 1 %
HCT: 20.9 % — ABNORMAL LOW (ref 36.0–46.0)
Hemoglobin: 7.2 g/dL — ABNORMAL LOW (ref 12.0–15.0)
LYMPHS ABS: 1.2 10*3/uL (ref 0.7–4.0)
LYMPHS PCT: 11 %
MCH: 28.6 pg (ref 26.0–34.0)
MCHC: 34.4 g/dL (ref 30.0–36.0)
MCV: 82.9 fL (ref 78.0–100.0)
MONO ABS: 0.6 10*3/uL (ref 0.1–1.0)
Monocytes Relative: 5 %
Neutro Abs: 8.9 10*3/uL — ABNORMAL HIGH (ref 1.7–7.7)
Neutrophils Relative %: 83 %
PLATELETS: 501 10*3/uL — AB (ref 150–400)
RBC: 2.52 MIL/uL — AB (ref 3.87–5.11)
RDW: 21.8 % — ABNORMAL HIGH (ref 11.5–15.5)
WBC: 10.8 10*3/uL — ABNORMAL HIGH (ref 4.0–10.5)

## 2017-05-19 LAB — TYPE AND SCREEN
ABO/RH(D): A POS
Antibody Screen: NEGATIVE
UNIT DIVISION: 0

## 2017-05-19 LAB — COMPREHENSIVE METABOLIC PANEL
ALBUMIN: 4.6 g/dL (ref 3.5–5.0)
ALT: 13 U/L — AB (ref 14–54)
AST: 26 U/L (ref 15–41)
Alkaline Phosphatase: 32 U/L — ABNORMAL LOW (ref 38–126)
Anion gap: 10 (ref 5–15)
BUN: 5 mg/dL — AB (ref 6–20)
CHLORIDE: 108 mmol/L (ref 101–111)
CO2: 25 mmol/L (ref 22–32)
CREATININE: 0.47 mg/dL (ref 0.44–1.00)
Calcium: 9.4 mg/dL (ref 8.9–10.3)
GFR calc Af Amer: >60 mL/min (ref >60)
Glucose, Bld: 111 mg/dL — ABNORMAL HIGH (ref 65–99)
POTASSIUM: 3.1 mmol/L — AB (ref 3.5–5.1)
Sodium: 143 mmol/L (ref 135–145)
Total Bilirubin: 2.9 mg/dL — ABNORMAL HIGH (ref 0.3–1.2)
Total Protein: 7.8 g/dL (ref 6.5–8.1)

## 2017-05-19 LAB — BPAM RBC
Blood Product Expiration Date: 201904302359
Unit Type and Rh: 5100

## 2017-05-19 LAB — I-STAT TROPONIN, ED: Troponin i, poc: 0 ng/mL (ref 0.00–0.08)

## 2017-05-19 LAB — I-STAT BETA HCG BLOOD, ED (MC, WL, AP ONLY)

## 2017-05-19 LAB — RETICULOCYTES
RBC.: 2.52 MIL/uL — ABNORMAL LOW (ref 3.87–5.11)
RETIC CT PCT: 11.3 % — AB (ref 0.4–3.1)
Retic Count, Absolute: 284.8 10*3/uL — ABNORMAL HIGH (ref 19.0–186.0)

## 2017-05-19 MED ORDER — ONDANSETRON HCL 4 MG/2ML IJ SOLN
4.0000 mg | INTRAMUSCULAR | Status: DC | PRN
  Administered 2017-05-19: 4 mg via INTRAVENOUS
  Filled 2017-05-19: qty 2

## 2017-05-19 MED ORDER — OXYCODONE-ACETAMINOPHEN 5-325 MG PO TABS
1.0000 | ORAL_TABLET | Freq: Once | ORAL | Status: AC
  Administered 2017-05-19: 1 via ORAL
  Filled 2017-05-19: qty 1

## 2017-05-19 MED ORDER — HYDROMORPHONE HCL 2 MG/ML IJ SOLN
2.0000 mg | INTRAMUSCULAR | Status: AC
  Administered 2017-05-19: 2 mg via INTRAVENOUS
  Filled 2017-05-19: qty 1

## 2017-05-19 MED ORDER — POTASSIUM CHLORIDE CRYS ER 20 MEQ PO TBCR
40.0000 meq | EXTENDED_RELEASE_TABLET | Freq: Once | ORAL | Status: AC
  Administered 2017-05-19: 40 meq via ORAL
  Filled 2017-05-19: qty 2

## 2017-05-19 MED ORDER — HYDROMORPHONE HCL 2 MG/ML IJ SOLN: 2.0000 mg | INTRAMUSCULAR | Status: AC

## 2017-05-19 MED ORDER — OXYCODONE HCL 15 MG PO TABA: 15.0000 mg | ORAL_TABLET | Freq: Two times a day (BID) | ORAL | 0 refills | Status: AC | PRN

## 2017-05-19 MED ORDER — DEXTROSE-NACL 5-0.45 % IV SOLN
INTRAVENOUS | Status: DC
  Administered 2017-05-19: 14:00:00 via INTRAVENOUS

## 2017-05-19 MED ORDER — DIPHENHYDRAMINE HCL 25 MG PO CAPS
25.0000 mg | ORAL_CAPSULE | ORAL | Status: DC | PRN
  Administered 2017-05-19: 25 mg via ORAL
  Filled 2017-05-19: qty 1

## 2017-05-19 MED ORDER — HYDROMORPHONE HCL 2 MG/ML IJ SOLN: 2.0000 mg | INTRAMUSCULAR | Status: DC

## 2017-05-19 NOTE — Telephone Encounter (Signed)
Patient called requesting to be treated at day hospital for sickle cell pain crisis. Patient reports dizziness, chest pain and generalized pain rated 10/10. Patient's mother also reports that patient feels warm to the touch. Dr. Doreene Burke notified and advised patient to go to the ED for treatment due to chest pain and possible fever. Patient and patient's mother advised to go to the ED for treatment and they express an understanding of instructions.

## 2017-05-19 NOTE — ED Triage Notes (Signed)
Patient c/o SCC "all over" with chest pain since yesterday. Speaking in full sentences without difficulty.

## 2017-05-19 NOTE — ED Provider Notes (Signed)
Medical screening examination/treatment/procedure(s) were conducted as a shared visit with non-physician practitioner(s) and myself.  I personally evaluated the patient during the encounter. Briefly, the patient is a 27 y.o. female here with sickle cell pain. Just diacharged. Does not have home pain meds. Database check. Labs reassuring. No acute chest. Treated with fluids and pain meds. The patient is safe for discharge with strict return precautions. The patient appears reasonably screened and/or stabilized for discharge and I doubt any other medical condition or other [facility] requiring further screening, evaluation, or treatment in the ED at this time prior to discharge.     EKG Interpretation  Date/Time:  Tuesday May 19 2017 12:54:03 EDT Ventricular Rate:  92 PR Interval:    QRS Duration: 94 QT Interval:  373 QTC Calculation: 462 R Axis:   80 Text Interpretation:  Sinus rhythm Borderline T wave abnormalities No significant change since last tracing Confirmed by Addison Lank [phone]) on 05/19/2017 2:41:02 PM           Cardama, Grayce Sessions, MD 05/21/17 1106

## 2017-05-19 NOTE — ED Provider Notes (Signed)
[facility] DEPT Provider Note   CSN: [id number] Arrival date & time: 05/19/17  1246     History   Chief Complaint Chief Complaint  Patient presents with  . Sickle Cell Pain Crisis    HPI Wendy [name] is a 27 y.o. female with a history of sickle cell anemia who presents emergency department today for sickle cell pain crisis.  Patient states that last night she started having what she describes as her typical sickle cell crisis.  She describes her pain as generalized but notes that it is mostly in her lower back as well as her bilateral knees.  She is taken Tylenol at home without any relief.  She states she is followed at the sickle cell clinic and has an appointment today but was told to come here for pain control.  She denies having any opioids at home (not filled at discharge) that she has taken prior to arrival.  Patient also notes that last night she had central chest pain without radiation.  She states that this has greatly improved and is nearly gone at this time.  Patient notes no history of acute chest or PE in the past.  She denies any fever, chills, congestion, URI symptoms, cough, shortness of breath, dyspnea on exertion, orthopnea, lower leg swelling, unilateral leg pain associated with this.  She was just discharged home from the hospital yesterday after being admitted for sickle cell pain crisis.  To note the patient's hemoglobin had dropped to 6.5 prior to discharge on 4/13 but it rose to 7.0 on 4/14.  Patient denies any other symptoms including abdominal pain, nausea/vomiting/diarrhea, cuts patient, urinary frequency, dysuria, urinary urgency, hematuria, flank pain, or any other complaints at this time.  No bowel/bladder incontinence, saddle anesthesia, urinary retention, numbness/tingling/weakness of the lower extremities.  HPI  Past Medical History:  Diagnosis Date  . Anemia   . Sickle cell anemia (HCC)   . Tobacco abuse     Patient Active  Problem List   Diagnosis Date Noted  . Sickle cell crisis ([place]) 05/12/2017  . Sickle cell pain crisis ([place]) 05/27/2016  . Leukocytosis 05/27/2016  . Sickle cell anemia ([facility])     Past Surgical History:  Procedure Laterality Date  . CESAREAN SECTION    . CESAREAN SECTION WITH BILATERAL TUBAL LIGATION       OB History   None      Home Medications    Prior to Admission medications   Medication Sig Start Date End Date Taking? Authorizing Provider  acetaminophen (TYLENOL) 325 MG tablet Take 2 tablets (650 mg total) by mouth every 6 (six) hours. 05/02/17   Verner Mould, MD    Family History Family History  Problem Relation Age of Onset  . Sickle cell trait Father     Social History Social History   Tobacco Use  . Smoking status: Never Smoker  . Smokeless tobacco: Never Used  Substance Use Topics  . Alcohol use: No  . Drug use: No     Allergies   Patient has no known allergies.   Review of Systems Review of Systems  All other systems reviewed and are negative.    Physical Exam Updated Vital Signs BP 126/78 (BP Location: Left Arm)   Pulse 77   Temp 99.3 F (37.4 C) (Oral)   Resp 15   Wt 59.5 kg (131 lb 3.2 oz)   LMP 04/25/2017 Comment: tubal ligation  SpO2 97%   BMI 23.24 kg/m  Physical Exam  Constitutional: She appears well-developed and well-nourished.  HENT:  Head: Normocephalic and atraumatic.  Right Ear: External ear normal.  Left Ear: External ear normal.  Nose: Nose normal.  Mouth/Throat: Uvula is midline, oropharynx is clear and moist and mucous membranes are normal. No tonsillar exudate.  Eyes: Pupils are equal, round, and reactive to light. Right eye exhibits no discharge. Left eye exhibits no discharge. No scleral icterus.  Neck: Trachea normal. Neck supple. No JVD present. No spinous process tenderness present. Carotid bruit is not present. No neck rigidity. Normal range of motion present.  Cardiovascular: Normal rate,  regular rhythm and intact distal pulses.  No murmur heard. Pulses:      Radial pulses are 2+ on the right side, and 2+ on the left side.       Dorsalis pedis pulses are 2+ on the right side, and 2+ on the left side.       Posterior tibial pulses are 2+ on the right side, and 2+ on the left side.  No lower extremity swelling or edema. Calves symmetric in size bilaterally.  Pulmonary/Chest: Effort normal and breath sounds normal. She exhibits tenderness.  Patient satting at 97% on room air without supplemental oxygen with good waveform.  Speaking in full sentences without difficulty.  No evidence of respiratory distress.    Abdominal: Soft. Bowel sounds are normal. There is no tenderness. There is no rebound and no guarding.  Musculoskeletal: She exhibits no edema.       Right knee: She exhibits normal range of motion, no swelling and no effusion. Tenderness found.       Left knee: She exhibits normal range of motion, no swelling, no effusion and no ecchymosis. Tenderness found.  No cervical, thoracic or lumbar spinous tenderness palpation or step-offs.  Bilateral paraspinal lumbar tenderness palpation is present.  Normal range of motion of the lower extremities.  Compartments are soft.  She is neurovascular intact.  Lymphadenopathy:    She has no cervical adenopathy.  Neurological: She is alert.  Speech clear. Follows commands. No facial droop. PERRLA. EOM grossly intact. CN III-XII grossly intact. Grossly moves all extremities 4 without ataxia. Able and appropriate strength for age to upper and lower extremities bilaterally. Normal Gait.   Skin: Skin is warm and dry. No rash noted. She is not diaphoretic.  Psychiatric: She has a normal mood and affect.  Nursing note and vitals reviewed.    ED Treatments / Results  Labs (all labs ordered are listed, but only abnormal results are displayed) Labs Reviewed  COMPREHENSIVE METABOLIC PANEL - Abnormal; Notable for the following components:       Result Value   Potassium 3.1 (*)    Glucose, Bld 111 (*)    BUN 5 (*)    ALT 13 (*)    Alkaline Phosphatase 32 (*)    Total Bilirubin 2.9 (*)    All other components within normal limits  CBC WITH DIFFERENTIAL/PLATELET - Abnormal; Notable for the following components:   WBC 10.8 (*)    RBC 2.52 (*)    Hemoglobin 7.2 (*)    HCT 20.9 (*)    RDW 21.8 (*)    Platelets 501 (*)    Neutro Abs 8.9 (*)    All other components within normal limits  RETICULOCYTES - Abnormal; Notable for the following components:   Retic Ct Pct 11.3 (*)    RBC. 2.52 (*)    Retic Count, Absolute 284.8 (*)    All  other components within normal limits  I-STAT BETA HCG BLOOD, ED ([facility], [facility], [facility] ONLY)  I-STAT TROPONIN, ED    EKG EKG Interpretation  Date/Time:  Tuesday May 19 2017 12:54:03 EDT Ventricular Rate:  92 PR Interval:    QRS Duration: 94 QT Interval:  373 QTC Calculation: 462 R Axis:   80 Text Interpretation:  Sinus rhythm Borderline T wave abnormalities No significant change since last tracing Confirmed by Addison Lank [phone]) on 05/19/2017 2:41:02 PM   Radiology Dg Chest 2 View  Result Date: 05/19/2017 CLINICAL DATA:  Chest pain, sickle cell crisis EXAM: CHEST - 2 VIEW COMPARISON:  05/12/2017 FINDINGS: The heart size and mediastinal contours are within normal limits. Both lungs are clear. The visualized skeletal structures are unremarkable. IMPRESSION: No active cardiopulmonary disease. Electronically Signed   By: Franchot Gallo M.D.   On: 05/19/2017 13:56    Procedures Procedures (including critical care time)  Medications Ordered in ED Medications  dextrose 5 %-0.45 % sodium chloride infusion ( Intravenous New Bag/Given 05/19/17 1359)  diphenhydrAMINE (BENADRYL) capsule 25-50 mg (25 mg Oral Given 05/19/17 1400)  ondansetron (ZOFRAN) injection 4 mg (4 mg Intravenous Given 05/19/17 1400)  HYDROmorphone (DILAUDID) injection 2 mg (2 mg Intravenous Given 05/19/17 1400)    Or  HYDROmorphone  (DILAUDID) injection 2 mg ( Subcutaneous See Alternative 05/19/17 1400)  HYDROmorphone (DILAUDID) injection 2 mg (2 mg Intravenous Given 05/19/17 1436)    Or  HYDROmorphone (DILAUDID) injection 2 mg ( Subcutaneous See Alternative 05/19/17 1436)  potassium chloride SA (K-DUR,KLOR-CON) CR tablet 40 mEq (40 mEq Oral Given 05/19/17 1436)  oxyCODONE-acetaminophen (PERCOCET/ROXICET) 5-325 MG per tablet 1 tablet (1 tablet Oral Given 05/19/17 1544)     Initial Impression / Assessment and Plan / ED Course  I have reviewed the triage vital signs and the nursing notes.  Pertinent labs & imaging results that were available during my care of the patient were reviewed by me and considered in my medical decision making (see chart for details).     27 year old female with history of sickle cell anemia presenting for her typical sickle cell pain.  She was just discharged from the hospital yesterday and states she did not receive d/c rx for her home oxycodone.  She has been taking Tylenol at home without relief.  Patient does note that she has generalized pain including low back pain, knee pain and chest pain. Feels this is her typical sickle cell pain. Patient started sickle cell pain crisis protocol.  Labs and imaging pending.  Patient denies any shortness of breath, fever, chills, congestion, URI symptoms, cough or shortness of breath.  She is tender palpation over her chest wall.  She is without tachycardia or hypoxia.  No recent unilateral lower leg swelling.  Patient's chest x-ray is without any active cardiopulmonary disease.  Labs are reassuring and at baseline.  She was noted to be anemic while in the hospital but has improved since that time.  Given the patient is tender over her chest wall, without tachycardia or hypoxia, shortness of breath and reassuring ECG not feel patient needs further workup for PE at this time.  No evidence of acute chest syndrome.  Patient also with low back pain.  No midline  tenderness palpation.  Bowel/bladder incontinence.  No concern for cauda equina syndrome.  No focal neurologic deficits.  Patient with lower knee pain.  No evidence of septic joint.  Patient also noted to have increased bilirubin.  Abdominal exam without tenderness palpation, negative Percell Miller  sign.  Do not feel the patient needs further workup for this at this time.  Patient's pain improved in the department.  I spoke with Dr. Jonelle Sidle of sickle cell who feels the patient needs home medications reordered and follow-up on outpatient basis.  He does not feel patient requires admission at this time.  On repeat evaluation patient's pain has improved.  Will give home dose of pain medication and give short refill until patient is able to follow-up on outpatient basis.  Patient's pain is controlled the point she feels comfortable going home.  Patient reviewed in [place] and found to not have any discrepancies. The evaluation does not show pathology that would require ongoing emergent intervention or inpatient treatment. I advised the patient to follow[facility]up with [facility] this week. I advised the patient to return to the emergency department with new or worsening symptoms or new concerns. Specific return precautions discussed. The patient verbalized understanding and agreement with plan. All questions answered. No further questions at this time. The patient is hemodynamically stable, mentating appropriately and appears safe for discharge.  Patient case seen and discussed with Dr. Leonette Monarch who is in agreement with plan.   Final Clinical Impressions(s) / ED Diagnoses   Final diagnoses:  Sickle cell pain crisis [facility])    ED Discharge Orders        Ordered    oxyCODONE HCl 15 MG TABA  2 times daily PRN     05/19/17 1600       Jillyn Ledger, PA-C 05/19/17 1600

## 2017-05-19 NOTE — ED Notes (Addendum)
Pt ambulated from triage chair to door and stated she was hurting. RN with another Pt will inform when available.

## 2017-05-19 NOTE — Discharge Instructions (Addendum)
Follow up with your PCP/sickle cell center this week.   For pain control you may take: 800mg  of ibuprofen (that is usually four 200mg  over the counter pills) up to 3 times a day (please take with food) and acetaminophen 975mg  (this is 3 normal strength, 325mg , over the counter pills) up to four times a day. Please do not take more than this. Do not drink alcohol or combine with other medications that have acetaminophen or Ibuprofen as an ingredient (Read the labels!).    For breakthrough pain you may take Oxycodone. Do not drink alcohol drive or operate heavy machinery when taking. You are being provided a prescription for opiates (also known as narcotics) for pain control on an ?as needed? basis.  Opiates can be addictive and should only be used when absolutely necessary for pain control when other alternatives do not work.  We recommend you only use them for the recommended amount of time and only as prescribed.  Please do not take with other sedative medications or alcohol.  Please do not drive, operate machinery, or make important decisions while taking opiates.  Please note that these medications can be addictive and have high abuse potential.  Please keep these medications locked away from children, teenagers or any family members with history of substance abuse. Additionally, these medications may cause constipation - take over the counter stool softeners or add fiber to your diet to treat this (Metamucil, Psyllium Fiber, Colace, Miralax) Further refills will need to be obtained from your primary care doctor and will not be prescribed through the Emergency Department. You will test positive on most drug tests while taking this medication.   If you develop worsening or new concerning symptoms you can return to the emergency department for re-evaluation.

## 2017-05-21 ENCOUNTER — Ambulatory Visit: Payer: Medicare Other | Admitting: Family Medicine

## 2017-06-26 ENCOUNTER — Encounter (HOSPITAL_COMMUNITY): Payer: Self-pay | Admitting: Emergency Medicine

## 2017-06-26 ENCOUNTER — Emergency Department (HOSPITAL_COMMUNITY)
Admission: EM | Admit: 2017-06-26 | Discharge: 2017-06-26 | Disposition: A | Discharge status: Home / Self Care | Payer: Medicare Other | Attending: Emergency Medicine | Admitting: Emergency Medicine

## 2017-06-26 DIAGNOSIS — D5700 Hb-SS disease with crisis, unspecified: Secondary | ICD-10-CM | POA: Insufficient documentation

## 2017-06-26 LAB — CBC WITH DIFFERENTIAL/PLATELET
Basophils Absolute: 0 10*3/uL (ref 0.0–0.1)
Basophils Relative: 0 %
Eosinophils Absolute: 0.3 10*3/uL (ref 0.0–0.7)
Eosinophils Relative: 2 %
HEMATOCRIT: 22.9 % — AB (ref 36.0–46.0)
Hemoglobin: 7.6 g/dL — ABNORMAL LOW (ref 12.0–15.0)
Lymphocytes Relative: 30 %
Lymphs Abs: 3.4 10*3/uL (ref 0.7–4.0)
MCH: 28.7 pg (ref 26.0–34.0)
MCHC: 33.2 g/dL (ref 30.0–36.0)
MCV: 86.4 fL (ref 78.0–100.0)
MONO ABS: 1 10*3/uL (ref 0.1–1.0)
Monocytes Relative: 9 %
NEUTROS ABS: 6.5 10*3/uL (ref 1.7–7.7)
NEUTROS PCT: 59 %
Platelets: 444 10*3/uL — ABNORMAL HIGH (ref 150–400)
RBC: 2.65 MIL/uL — ABNORMAL LOW (ref 3.87–5.11)
RDW: 20.2 % — AB (ref 11.5–15.5)
WBC: 11.2 10*3/uL — ABNORMAL HIGH (ref 4.0–10.5)

## 2017-06-26 LAB — COMPREHENSIVE METABOLIC PANEL
ALK PHOS: 32 U/L — AB (ref 38–126)
ALT: 14 U/L (ref 14–54)
ANION GAP: 11 (ref 5–15)
AST: 30 U/L (ref 15–41)
Albumin: 5.1 g/dL — ABNORMAL HIGH (ref 3.5–5.0)
BILIRUBIN TOTAL: 2.5 mg/dL — AB (ref 0.3–1.2)
BUN: 6 mg/dL (ref 6–20)
CO2: 24 mmol/L (ref 22–32)
Calcium: 9.2 mg/dL (ref 8.9–10.3)
Chloride: 109 mmol/L (ref 101–111)
Creatinine, Ser: 0.52 mg/dL (ref 0.44–1.00)
GFR calc non Af Amer: >60 mL/min (ref >60)
GLUCOSE: 88 mg/dL (ref 65–99)
Potassium: 3.4 mmol/L — ABNORMAL LOW (ref 3.5–5.1)
Sodium: 144 mmol/L (ref 135–145)
TOTAL PROTEIN: 7.9 g/dL (ref 6.5–8.1)

## 2017-06-26 LAB — I-STAT BETA HCG BLOOD, ED (MC, WL, AP ONLY): I-stat hCG, quantitative: <5 m[IU]/mL (ref <5)

## 2017-06-26 LAB — RETICULOCYTES
RBC.: 2.65 MIL/uL — ABNORMAL LOW (ref 3.87–5.11)
RETIC COUNT ABSOLUTE: 386.9 10*3/uL — AB (ref 19.0–186.0)
Retic Ct Pct: 14.6 % — ABNORMAL HIGH (ref 0.4–3.1)

## 2017-06-26 MED ORDER — HYDROMORPHONE HCL 2 MG/ML IJ SOLN
2.0000 mg | INTRAMUSCULAR | Status: AC
  Administered 2017-06-26: 2 mg via INTRAVENOUS

## 2017-06-26 MED ORDER — HYDROMORPHONE HCL 2 MG/ML IJ SOLN
2.0000 mg | INTRAMUSCULAR | Status: AC
  Filled 2017-06-26: qty 1

## 2017-06-26 MED ORDER — HYDROMORPHONE HCL 2 MG/ML IJ SOLN: 2.0000 mg | INTRAMUSCULAR | Status: AC

## 2017-06-26 MED ORDER — HYDROMORPHONE HCL 2 MG/ML IJ SOLN: 2.0000 mg | INTRAMUSCULAR | Status: DC

## 2017-06-26 MED ORDER — ONDANSETRON HCL 4 MG/2ML IJ SOLN
4.0000 mg | INTRAMUSCULAR | Status: DC | PRN
  Administered 2017-06-26: 4 mg via INTRAVENOUS
  Filled 2017-06-26: qty 2

## 2017-06-26 MED ORDER — DEXTROSE-NACL 5-0.45 % IV SOLN
INTRAVENOUS | Status: DC
  Administered 2017-06-26: 19:00:00 via INTRAVENOUS

## 2017-06-26 MED ORDER — OXYCODONE-ACETAMINOPHEN 5-325 MG PO TABS: 1.0000 | ORAL_TABLET | ORAL | 0 refills | Status: DC | PRN

## 2017-06-26 MED ORDER — KETOROLAC TROMETHAMINE 30 MG/ML IJ SOLN
30.0000 mg | INTRAMUSCULAR | Status: AC
  Administered 2017-06-26: 30 mg via INTRAVENOUS
  Filled 2017-06-26: qty 1

## 2017-06-26 MED ORDER — DIPHENHYDRAMINE HCL 50 MG/ML IJ SOLN
25.0000 mg | Freq: Once | INTRAMUSCULAR | Status: AC
  Administered 2017-06-26: 25 mg via INTRAVENOUS
  Filled 2017-06-26: qty 1

## 2017-06-26 NOTE — ED Notes (Signed)
Pt. Still in the bathroom.

## 2017-06-26 NOTE — ED Provider Notes (Signed)
[facility] DEPT Provider Note   CSN: [id number] Arrival date & time: 06/26/17  1551     History   Chief Complaint Chief Complaint  Patient presents with  . Sickle Cell Pain Crisis    HPI Wendy [name] is a 27 y.o. female.  The history is provided by the patient. No language interpreter was used.  Sickle Cell Pain Crisis  Location:  Diffuse Severity:  Severe Onset quality:  Gradual Duration:  2 days Similar to previous crisis episodes: yes   Timing:  Constant Progression:  Waxing and waning Chronicity:  Recurrent History of pulmonary emboli: no   Context: not infection and not non-compliance   Relieved by:  Nothing Worsened by:  Nothing Ineffective treatments:  None tried Associated symptoms: no chest pain, no congestion, no cough, no fatigue, no fever, no headaches, no nausea, no shortness of breath, no swelling of legs, no vision change, no vomiting and no wheezing   Risk factors: frequent pain crises   Risk factors: no hx of pneumonia and no prior acute chest     Past Medical History:  Diagnosis Date  . Anemia   . Sickle cell anemia (HCC)   . Tobacco abuse     Patient Active Problem List   Diagnosis Date Noted  . Sickle cell crisis ([place]) 05/12/2017  . Sickle cell pain crisis ([place]) 05/27/2016  . Leukocytosis 05/27/2016  . Sickle cell anemia (HCC)     Past Surgical History:  Procedure Laterality Date  . CESAREAN SECTION    . CESAREAN SECTION WITH BILATERAL TUBAL LIGATION       OB History   None      Home Medications    Prior to Admission medications   Medication Sig Start Date End Date Taking? Authorizing Provider  acetaminophen (TYLENOL) 325 MG tablet Take 2 tablets (650 mg total) by mouth every 6 (six) hours. 05/02/17  Yes Verner Mould, MD    Family History Family History  Problem Relation Age of Onset  . Sickle cell trait Father     Social History Social History   Tobacco Use  . Smoking  status: Never Smoker  . Smokeless tobacco: Never Used  Substance Use Topics  . Alcohol use: No  . Drug use: No     Allergies   Patient has no known allergies.   Review of Systems Review of Systems  Constitutional: Negative for chills, diaphoresis, fatigue and fever.  HENT: Negative for congestion.   Respiratory: Negative for cough, chest tightness, shortness of breath, wheezing and stridor.   Cardiovascular: Negative for chest pain and palpitations.  Gastrointestinal: Negative for abdominal distention, abdominal pain, nausea and vomiting.  Genitourinary: Negative for dysuria, flank pain and frequency.  Musculoskeletal: Negative for back pain, neck pain and neck stiffness.  Neurological: Negative for headaches.  Psychiatric/Behavioral: Negative for agitation.  All other systems reviewed and are negative.    Physical Exam Updated Vital Signs BP 112/76 (BP Location: Left Arm)   Pulse 99   Temp 97.8 F (36.6 C) (Oral)   Resp 17   LMP 06/15/2017   SpO2 100%   Physical Exam  Constitutional: She is oriented to person, place, and time. She appears well-developed and well-nourished. No distress.  HENT:  Head: Normocephalic and atraumatic.  Nose: Nose normal.  Mouth/Throat: Oropharynx is clear and moist. No oropharyngeal exudate.  Eyes: Pupils are equal, round, and reactive to light. Conjunctivae and EOM are normal.  Neck: Normal range of motion. Neck supple.  Cardiovascular: Normal rate, regular rhythm and intact distal pulses.  No murmur heard. Pulmonary/Chest: Effort normal and breath sounds normal. No respiratory distress. She has no wheezes. She has no rales. She exhibits no tenderness.  Abdominal: Soft. There is no tenderness.  Musculoskeletal: She exhibits no edema or tenderness.  Lymphadenopathy:    She has no cervical adenopathy.  Neurological: She is alert and oriented to person, place, and time. No cranial nerve deficit or sensory deficit. She exhibits normal  muscle tone. Coordination normal.  Skin: Skin is warm and dry. Capillary refill takes less than 2 seconds. No rash noted. She is not diaphoretic.  Psychiatric: She has a normal mood and affect.  Nursing note and vitals reviewed.    ED Treatments / Results  Labs (all labs ordered are listed, but only abnormal results are displayed) Labs Reviewed  COMPREHENSIVE METABOLIC PANEL - Abnormal; Notable for the following components:      Result Value   Potassium 3.4 (*)    Albumin 5.1 (*)    Alkaline Phosphatase 32 (*)    Total Bilirubin 2.5 (*)    All other components within normal limits  CBC WITH DIFFERENTIAL/PLATELET - Abnormal; Notable for the following components:   WBC 11.2 (*)    RBC 2.65 (*)    Hemoglobin 7.6 (*)    HCT 22.9 (*)    RDW 20.2 (*)    Platelets 444 (*)    All other components within normal limits  RETICULOCYTES - Abnormal; Notable for the following components:   Retic Ct Pct 14.6 (*)    RBC. 2.65 (*)    Retic Count, Absolute 386.9 (*)    All other components within normal limits  I-STAT BETA HCG BLOOD, ED ([facility], [facility], [facility] ONLY)    EKG None  Radiology No results found.  Procedures Procedures (including critical care time)  Medications Ordered in ED Medications  HYDROmorphone (DILAUDID) injection 2 mg (has no administration in time range)    Or  HYDROmorphone (DILAUDID) injection 2 mg (has no administration in time range)  HYDROmorphone (DILAUDID) injection 2 mg (2 mg Intravenous Given 06/26/17 1959)    Or  HYDROmorphone (DILAUDID) injection 2 mg ( Subcutaneous See Alternative 06/26/17 1959)  HYDROmorphone (DILAUDID) injection 2 mg (has no administration in time range)    Or  HYDROmorphone (DILAUDID) injection 2 mg (has no administration in time range)  ketorolac (TORADOL) 30 MG/ML injection 30 mg (30 mg Intravenous Given 06/26/17 1846)  diphenhydrAMINE (BENADRYL) injection 25 mg (25 mg Intravenous Given 06/26/17 1846)  diphenhydrAMINE (BENADRYL) injection 25  mg (25 mg Intravenous Given 06/26/17 2030)     Initial Impression / Assessment and Plan / ED Course  I have reviewed the triage vital signs and the nursing notes.  Pertinent labs & imaging results that were available during my care of the patient were reviewed by me and considered in my medical decision making (see chart for details).     Wendy [name] is a 27 y.o. female with past medical history significant for sickle cell disease who presents with pain crisis.  She reports that she last had a crisis 1 week ago.  She says that she had done well until yesterday when her pain returned.  Is at home medicines were not helping.  She describes her pain as 10 out of 10 and diffuse.  She reports no fevers, urinary symptoms, constipation, diarrhea, congestion, or cough.  She reports her pain is everywhere except her chest.  She says this is  typical of her crises.  She denies any history of acute chest or pneumonias.  On exam, lungs are clear.  Chest is nontender.  Back is nontender.  No focal neurologic deficit seen.  Abdomen nontender.  Patient will have screening laboratory work-up for sickle cell and will also have administration of pain, medicine, nausea medicine, fluids, and pruritus medicine.  Anticipate reassessment after work-up.  Patient is unsure he will be feeling well enough to go home or if she needs to admission.  After medications patient was feeling much better and wanted to go home.  She was not interested in staying for the third dose of medication.  Patient had similar laboratory testing to prior.  Patient continues to deny any signs or symptoms of infection and did not want chest x-ray or further work-up.  Next  Patient will follow-up with her PCP and sickle cell team tomorrow and was given a prescription for several pills of oxycodone to get her to her appointment.  Patient was understanding of return precautions and plan of care and patient was discharged in good condition with  resolution of presenting crisis.  Final Clinical Impressions(s) / ED Diagnoses   Final diagnoses:  Sickle cell pain crisis [facility])    ED Discharge Orders        Ordered    oxyCODONE-acetaminophen (PERCOCET/ROXICET) 5-325 MG tablet  Every 4 hours PRN     06/26/17 2124      Clinical Impression: 1. Sickle cell pain crisis ([place])     Disposition: Discharge  Condition: Good  I have discussed the results, Dx and Tx plan with the pt(& family if present). He/she/they expressed understanding and agree(s) with the plan. Discharge instructions discussed at great length. Strict return precautions discussed and pt &/or family have verbalized understanding of the instructions. No further questions at time of discharge.    Discharge Medication List as of 06/26/2017  9:24 PM    START taking these medications   Details  oxyCODONE-acetaminophen (PERCOCET/ROXICET) 5-325 MG tablet Take 1 tablet by mouth every 4 (four) hours as needed for severe pain., Starting Fri 06/26/2017, Print        Follow Up: [facility] [address] [place] [phone] Schedule an appointment as soon as possible for a visit    [facility] DEPT [place] [phone] mc [place]       Tegeler, Gwenyth Allegra, MD 06/27/17 [phone]

## 2017-06-26 NOTE — Discharge Instructions (Signed)
Your work-up today showed evidence of sickle cell pain crisis.  Given your lack of other symptoms we do not feel you have acute chest syndrome or need admission since her pain is improved.  Please follow-up with your doctor tomorrow for further pain management.  If any symptoms change or worsen, please return to the nearest emergency department.

## 2017-06-26 NOTE — ED Triage Notes (Signed)
Per [facility] pt c.o sickle cell pain and ran out of her meds.

## 2017-07-19 ENCOUNTER — Observation Stay (HOSPITAL_BASED_OUTPATIENT_CLINIC_OR_DEPARTMENT_OTHER)
Admission: EM | Admit: 2017-07-19 | Discharge: 2017-07-20 | Disposition: A | Discharge status: Home / Self Care | Payer: Medicare Other | Source: Home / Self Care | Attending: Emergency Medicine | Admitting: Emergency Medicine

## 2017-07-19 ENCOUNTER — Other Ambulatory Visit: Payer: Self-pay

## 2017-07-19 ENCOUNTER — Encounter (HOSPITAL_COMMUNITY): Payer: Self-pay

## 2017-07-19 DIAGNOSIS — J189 Pneumonia, unspecified organism: Secondary | ICD-10-CM | POA: Diagnosis not present

## 2017-07-19 DIAGNOSIS — F17200 Nicotine dependence, unspecified, uncomplicated: Secondary | ICD-10-CM | POA: Insufficient documentation

## 2017-07-19 DIAGNOSIS — D5700 Hb-SS disease with crisis, unspecified: Secondary | ICD-10-CM

## 2017-07-19 DIAGNOSIS — D571 Sickle-cell disease without crisis: Secondary | ICD-10-CM | POA: Diagnosis present

## 2017-07-19 DIAGNOSIS — Z9981 Dependence on supplemental oxygen: Secondary | ICD-10-CM | POA: Insufficient documentation

## 2017-07-19 DIAGNOSIS — G894 Chronic pain syndrome: Secondary | ICD-10-CM | POA: Diagnosis not present

## 2017-07-19 DIAGNOSIS — R0902 Hypoxemia: Secondary | ICD-10-CM | POA: Insufficient documentation

## 2017-07-19 DIAGNOSIS — R0602 Shortness of breath: Secondary | ICD-10-CM

## 2017-07-19 DIAGNOSIS — Z79899 Other long term (current) drug therapy: Secondary | ICD-10-CM

## 2017-07-19 DIAGNOSIS — D5701 Hb-SS disease with acute chest syndrome: Secondary | ICD-10-CM | POA: Diagnosis not present

## 2017-07-19 DIAGNOSIS — R000 Tachycardia, unspecified: Secondary | ICD-10-CM

## 2017-07-19 DIAGNOSIS — J9611 Chronic respiratory failure with hypoxia: Secondary | ICD-10-CM | POA: Diagnosis not present

## 2017-07-19 DIAGNOSIS — D72829 Elevated white blood cell count, unspecified: Secondary | ICD-10-CM | POA: Diagnosis not present

## 2017-07-19 DIAGNOSIS — E876 Hypokalemia: Secondary | ICD-10-CM | POA: Diagnosis not present

## 2017-07-19 DIAGNOSIS — Z832 Family history of diseases of the blood and blood-forming organs and certain disorders involving the immune mechanism: Secondary | ICD-10-CM | POA: Insufficient documentation

## 2017-07-19 LAB — I-STAT BETA HCG BLOOD, ED (MC, WL, AP ONLY)

## 2017-07-19 LAB — CBC WITH DIFFERENTIAL/PLATELET
Abs Immature Granulocytes: 0 10*3/uL (ref 0.0–0.1)
BASOS ABS: 0.1 10*3/uL (ref 0.0–0.1)
Basophils Relative: 1 %
Eosinophils Absolute: 0.3 10*3/uL (ref 0.0–0.7)
Eosinophils Relative: 3 %
HEMATOCRIT: 24.7 % — AB (ref 36.0–46.0)
HEMOGLOBIN: 8 g/dL — AB (ref 12.0–15.0)
Immature Granulocytes: 0 %
LYMPHS ABS: 2.4 10*3/uL (ref 0.7–4.0)
LYMPHS PCT: 22 %
MCH: 28.6 pg (ref 26.0–34.0)
MCHC: 32.4 g/dL (ref 30.0–36.0)
MCV: 88.2 fL (ref 78.0–100.0)
MONO ABS: 0.7 10*3/uL (ref 0.1–1.0)
Monocytes Relative: 7 %
NEUTROS ABS: 7.2 10*3/uL (ref 1.7–7.7)
Neutrophils Relative %: 67 %
Platelets: 468 10*3/uL — ABNORMAL HIGH (ref 150–400)
RBC: 2.8 MIL/uL — ABNORMAL LOW (ref 3.87–5.11)
RDW: 18.8 % — ABNORMAL HIGH (ref 11.5–15.5)
WBC: 10.6 10*3/uL — ABNORMAL HIGH (ref 4.0–10.5)

## 2017-07-19 LAB — BASIC METABOLIC PANEL
Anion gap: 8 (ref 5–15)
BUN: 5 mg/dL — ABNORMAL LOW (ref 6–20)
CHLORIDE: 108 mmol/L (ref 101–111)
CO2: 23 mmol/L (ref 22–32)
CREATININE: 0.55 mg/dL (ref 0.44–1.00)
Calcium: 9.3 mg/dL (ref 8.9–10.3)
GFR calc non Af Amer: >60 mL/min (ref >60)
Glucose, Bld: 86 mg/dL (ref 65–99)
Potassium: 3.4 mmol/L — ABNORMAL LOW (ref 3.5–5.1)
SODIUM: 139 mmol/L (ref 135–145)

## 2017-07-19 LAB — RETICULOCYTES
RBC.: 2.8 MIL/uL — ABNORMAL LOW (ref 3.87–5.11)
RETIC CT PCT: 9.9 % — AB (ref 0.4–3.1)
Retic Count, Absolute: 276.2 10*3/uL — ABNORMAL HIGH (ref 19.0–186.0)

## 2017-07-19 MED ORDER — HYDROMORPHONE HCL 2 MG/ML IJ SOLN
2.0000 mg | INTRAMUSCULAR | Status: AC
  Administered 2017-07-19: 2 mg via INTRAVENOUS
  Filled 2017-07-19: qty 1

## 2017-07-19 MED ORDER — HYDROMORPHONE HCL 2 MG/ML IJ SOLN: 2.0000 mg | INTRAMUSCULAR | Status: DC

## 2017-07-19 MED ORDER — HYDROMORPHONE HCL 2 MG/ML IJ SOLN
2.0000 mg | INTRAMUSCULAR | Status: AC
  Administered 2017-07-19: 2 mg via INTRAVENOUS

## 2017-07-19 MED ORDER — HYDROMORPHONE HCL 2 MG/ML IJ SOLN
2.0000 mg | INTRAMUSCULAR | Status: DC
  Filled 2017-07-19 (×2): qty 1

## 2017-07-19 MED ORDER — ONDANSETRON HCL 4 MG/2ML IJ SOLN
4.0000 mg | INTRAMUSCULAR | Status: DC | PRN
Start: 2017-07-19 — End: 2017-07-20
  Administered 2017-07-19: 4 mg via INTRAVENOUS
  Filled 2017-07-19: qty 2

## 2017-07-19 MED ORDER — HYDROMORPHONE HCL 2 MG/ML IJ SOLN
2.0000 mg | INTRAMUSCULAR | Status: DC
  Administered 2017-07-19: 2 mg via INTRAVENOUS
  Filled 2017-07-19 (×2): qty 1

## 2017-07-19 MED ORDER — DIPHENHYDRAMINE HCL 25 MG PO CAPS
50.0000 mg | ORAL_CAPSULE | Freq: Once | ORAL | Status: AC
  Administered 2017-07-19: 50 mg via ORAL
  Filled 2017-07-19: qty 2

## 2017-07-19 MED ORDER — DEXTROSE-NACL 5-0.45 % IV SOLN
INTRAVENOUS | Status: DC
  Administered 2017-07-19: 16:00:00 via INTRAVENOUS

## 2017-07-19 MED ORDER — SENNOSIDES-DOCUSATE SODIUM 8.6-50 MG PO TABS: 1.0000 | ORAL_TABLET | Freq: Two times a day (BID) | ORAL | Status: DC

## 2017-07-19 MED ORDER — ENOXAPARIN SODIUM 40 MG/0.4ML ~~LOC~~ SOLN: 40.0000 mg | Freq: Every day | SUBCUTANEOUS | Status: DC

## 2017-07-19 MED ORDER — HYDROXYZINE HCL 25 MG PO TABS
25.0000 mg | ORAL_TABLET | Freq: Once | ORAL | Status: AC
  Administered 2017-07-19: 25 mg via ORAL
  Filled 2017-07-19: qty 1

## 2017-07-19 MED ORDER — ONDANSETRON HCL 4 MG/2ML IJ SOLN: 4.0000 mg | Freq: Four times a day (QID) | INTRAMUSCULAR | Status: DC | PRN

## 2017-07-19 MED ORDER — HYDROMORPHONE HCL 2 MG/ML IJ SOLN: 2.0000 mg | INTRAMUSCULAR | Status: AC

## 2017-07-19 MED ORDER — KETOROLAC TROMETHAMINE 15 MG/ML IJ SOLN
15.0000 mg | Freq: Four times a day (QID) | INTRAMUSCULAR | Status: DC
  Administered 2017-07-19 – 2017-07-20 (×2): 15 mg via INTRAVENOUS
  Filled 2017-07-19 (×2): qty 1

## 2017-07-19 MED ORDER — SODIUM CHLORIDE 0.9% FLUSH: 9.0000 mL | INTRAVENOUS | Status: DC | PRN

## 2017-07-19 MED ORDER — HYDROMORPHONE 1 MG/ML IV SOLN
INTRAVENOUS | Status: DC
  Administered 2017-07-19: 23:00:00 via INTRAVENOUS
  Administered 2017-07-20: 3 mg via INTRAVENOUS
  Administered 2017-07-20: 1.4 mg via INTRAVENOUS
  Filled 2017-07-19: qty 25

## 2017-07-19 MED ORDER — POLYETHYLENE GLYCOL 3350 17 G PO PACK: 17.0000 g | PACK | Freq: Every day | ORAL | Status: DC | PRN

## 2017-07-19 MED ORDER — DEXTROSE-NACL 5-0.45 % IV SOLN
INTRAVENOUS | Status: DC
  Administered 2017-07-19: 23:00:00 via INTRAVENOUS

## 2017-07-19 MED ORDER — NALOXONE HCL 0.4 MG/ML IJ SOLN: 0.4000 mg | INTRAMUSCULAR | Status: DC | PRN

## 2017-07-19 NOTE — ED Notes (Signed)
Admitting at bedside 

## 2017-07-19 NOTE — ED Notes (Signed)
RN attempted x2 with blood draw and IV

## 2017-07-19 NOTE — ED Notes (Signed)
ED Provider at bedside. 

## 2017-07-19 NOTE — ED Notes (Signed)
Pt transported to [facility] with [facility] via stretcher. Pt has all belongings.

## 2017-07-19 NOTE — ED Provider Notes (Signed)
[facility] EMERGENCY DEPARTMENT Provider Note   CSN: [id number] Arrival date & time: 07/19/17  1409     History   Chief Complaint Chief Complaint  Patient presents with  . Sickle Cell Pain Crisis    HPI Wendy [name] is a 27 y.o. female.  The history is provided by the patient.  Sickle Cell Pain Crisis  Location:  Back and lower extremity Severity:  Moderate Onset quality:  Gradual Duration:  1 day Similar to previous crisis episodes: yes   Timing:  Constant Progression:  Worsening Chronicity:  Recurrent Sickle cell genotype:  [place] Usual hemoglobin level:  7 Date of last transfusion:  Last year sometime Frequency of attacks:  Monthly History of pulmonary emboli: no   Context: not alcohol consumption and not change in medication   Context comment:  Recently moved here from another part of [place] and currently does not have any pain medication at home that she can take when symptoms start Relieved by:  Nothing Worsened by:  Activity Ineffective treatments:  None tried Associated symptoms: no chest pain, no congestion, no cough, no fever, no leg ulcers, no nausea, no shortness of breath, no swelling of legs, no vomiting and no wheezing   Risk factors: frequent pain crises and smoking   Risk factors: no cholecystectomy, no frequent admissions for fever, no frequent admissions for pain, no hx of stroke and no prior acute chest     Past Medical History:  Diagnosis Date  . Anemia   . Sickle cell anemia (HCC)   . Tobacco abuse     Patient Active Problem List   Diagnosis Date Noted  . Sickle cell crisis ([place]) 05/12/2017  . Sickle cell pain crisis ([place]) 05/27/2016  . Leukocytosis 05/27/2016  . Sickle cell anemia (HCC)     Past Surgical History:  Procedure Laterality Date  . CESAREAN SECTION    . CESAREAN SECTION WITH BILATERAL TUBAL LIGATION       OB History   None      Home Medications    Prior to Admission medications     Medication Sig Start Date End Date Taking? Authorizing Provider  acetaminophen (TYLENOL) 325 MG tablet Take 2 tablets (650 mg total) by mouth every 6 (six) hours. 05/02/17   Verner Mould, MD  oxyCODONE-acetaminophen (PERCOCET/ROXICET) 5-325 MG tablet Take 1 tablet by mouth every 4 (four) hours as needed for severe pain. 06/26/17   Tegeler, Gwenyth Allegra, MD    Family History Family History  Problem Relation Age of Onset  . Sickle cell trait Father     Social History Social History   Tobacco Use  . Smoking status: Never Smoker  . Smokeless tobacco: Never Used  Substance Use Topics  . Alcohol use: No  . Drug use: No     Allergies   Patient has no known allergies.   Review of Systems Review of Systems  Constitutional: Negative for fever.  HENT: Negative for congestion.   Respiratory: Negative for cough, shortness of breath and wheezing.   Cardiovascular: Negative for chest pain.  Gastrointestinal: Negative for nausea and vomiting.  All other systems reviewed and are negative.    Physical Exam Updated Vital Signs BP 113/77 (BP Location: Right Arm)   Pulse (!) 58   Temp 98.8 F (37.1 C) (Oral)   Resp 18   Ht 5\' 6"  (1.676 m)   Wt 56.7 kg (125 lb)   LMP  (LMP Unknown)   SpO2 99%  BMI 20.18 kg/m   Physical Exam  Constitutional: She is oriented to person, place, and time. She appears well-developed and well-nourished. No distress.  HENT:  Head: Normocephalic and atraumatic.  Mouth/Throat: Oropharynx is clear and moist.  Eyes: Pupils are equal, round, and reactive to light. Conjunctivae and EOM are normal.  Neck: Normal range of motion. Neck supple.  Cardiovascular: Normal rate, regular rhythm and intact distal pulses.  No murmur heard. Pulmonary/Chest: Effort normal and breath sounds normal. No respiratory distress. She has no wheezes. She has no rales.  Abdominal: Soft. She exhibits no distension. There is no tenderness. There is no rebound and no  guarding.  Musculoskeletal: Normal range of motion. She exhibits tenderness. She exhibits no edema.       Lumbar back: She exhibits tenderness.       Back:       Legs: Anterior thigh pain bilaterally but no evidence of joint swelling or erythema.  Neurological: She is alert and oriented to person, place, and time.  Skin: Skin is warm and dry. No rash noted. No erythema.  Psychiatric: She has a normal mood and affect. Her behavior is normal.  Nursing note and vitals reviewed.    ED Treatments / Results  Labs (all labs ordered are listed, but only abnormal results are displayed) Labs Reviewed  CBC WITH DIFFERENTIAL/PLATELET  RETICULOCYTES  BASIC METABOLIC PANEL  I-STAT BETA HCG BLOOD, ED ([facility], [facility], [facility] ONLY)    EKG None  Radiology No results found.  Procedures Procedures (including critical care time)  Medications Ordered in ED Medications  dextrose 5 %-0.45 % sodium chloride infusion (has no administration in time range)  HYDROmorphone (DILAUDID) injection 2 mg (has no administration in time range)    Or  HYDROmorphone (DILAUDID) injection 2 mg (has no administration in time range)  HYDROmorphone (DILAUDID) injection 2 mg (has no administration in time range)    Or  HYDROmorphone (DILAUDID) injection 2 mg (has no administration in time range)  HYDROmorphone (DILAUDID) injection 2 mg (has no administration in time range)    Or  HYDROmorphone (DILAUDID) injection 2 mg (has no administration in time range)  HYDROmorphone (DILAUDID) injection 2 mg (has no administration in time range)    Or  HYDROmorphone (DILAUDID) injection 2 mg (has no administration in time range)  ondansetron (ZOFRAN) injection 4 mg (has no administration in time range)     Initial Impression / Assessment and Plan / ED Course  I have reviewed the triage vital signs and the nursing notes.  Pertinent labs & imaging results that were available during my care of the patient were reviewed by me and  considered in my medical decision making (see chart for details).     Patient with history of sickle cell anemia presenting today with symptoms consistent with sickle cell crisis.  She is having pain in her back and anterior thighs.  She denies any infectious symptoms or chest related symptoms.  She has no abdominal pain or vomiting.  On exam vital signs are stable.  Patient states that she did not take anything for this at home because she ran out of Percocet as she has recently relocated to [facility] and his not been able to follow-up with her regular doctor yet.  She is currently in that process and someone is helping her. Sickle cell order set initiated  Labs pending and pt just getting meds.  Will check out to PA.  Final Clinical Impressions(s) / ED Diagnoses   Final diagnoses:  None    ED Discharge Orders    None       Blanchie Dessert, MD 07/20/17 2248

## 2017-07-19 NOTE — ED Triage Notes (Addendum)
Per [facility], pt arrives from [facility] with c/o bilateral knee and lower back pain. Pt recently moved here from [facility] and is out of pain medicine. Pt rates pain 10/10. Pt ambulatory on arrival. Pt reports pain starting yesterday.

## 2017-07-19 NOTE — ED Provider Notes (Signed)
Pt signed out to me by Purcell Mouton, MD. Please see previous notes for further history.   In brief, patient with a history of sickle cell disease.  Presenting today with pain consistent with her sickle cell crisis, pain in her back and legs.  She recently moved to the area, has not been able to establish care with a primary care doctor yet.  She ran out of her Percocet yesterday.  She denies fevers, chills, chest pain, shortness of breath.  States this feels consistent with her normal crises. Labs pending. Sickle cell protocol started.  Labs appear at baseline.  White count improved from previous, hemoglobin stable at 8.  On reassessment, patient states her pain is mildly improved, but still a 10 out of 10.  We will continue sickle cell protocol and reassess.  On reassessment after third dose of Dilaudid, patient reports pain is still severe.  She does not feel like she will be able to go home and manage her pain.  Will consult for admission.  Discussed with hospitalist, patient to be admitted.   Franchot Heidelberg, PA-C 07/19/17 2022    Hayden Rasmussen, MD 07/20/17 1146

## 2017-07-19 NOTE — H&P (Signed)
History and Physical    Wendy [name] XIP:[id number] DOB: 1990-06-03 DOA: 07/19/2017  PCP: Patient, No Pcp Per  Patient coming from: Home.  Chief Complaint: Lower extremity pain.  HPI: Wendy [name] is a 27 y.o. female with history of sickle cell anemia presents with increasing pain in the lower extremities and low back over the last 2 days.  Denies any chest pain shortness of breath productive cough fever chills headache or any visual symptoms.  Patient states she ran out of her Percocet which she usually takes during pain crisis.  ED Course: In the ER patient was afebrile not hypoxic.  Labs revealed hemoglobin to be at around baseline.  Despite getting pain relief medication patient was told in pain and admitted for further pain relief from sickle cell crisis.  Review of Systems: As per HPI, rest all negative.   Past Medical History:  Diagnosis Date  . Anemia   . Sickle cell anemia (HCC)   . Tobacco abuse     Past Surgical History:  Procedure Laterality Date  . CESAREAN SECTION    . CESAREAN SECTION WITH BILATERAL TUBAL LIGATION       reports that she has never smoked. She has never used smokeless tobacco. She reports that she does not drink alcohol or use drugs.  No Known Allergies  Family History  Problem Relation Age of Onset  . Sickle cell trait Father     Prior to Admission medications   Medication Sig Start Date End Date Taking? Authorizing Provider  acetaminophen (TYLENOL) 325 MG tablet Take 2 tablets (650 mg total) by mouth every 6 (six) hours. 05/02/17   Verner Mould, MD  oxyCODONE-acetaminophen (PERCOCET/ROXICET) 5-325 MG tablet Take 1 tablet by mouth every 4 (four) hours as needed for severe pain. 06/26/17   Tegeler, Gwenyth Allegra, MD    Physical Exam: Vitals:   07/19/17 1721 07/19/17 1730 07/19/17 1800 07/19/17 2030  BP: 107/71 (!) 108/97 102/72 (!) 103/52  Pulse: 99   (!) 103  Resp: 18   18  Temp:      TempSrc:      SpO2: 96% 94%  93%    Weight:      Height:          Constitutional: Moderately built and nourished. Vitals:   07/19/17 1721 07/19/17 1730 07/19/17 1800 07/19/17 2030  BP: 107/71 (!) 108/97 102/72 (!) 103/52  Pulse: 99   (!) 103  Resp: 18   18  Temp:      TempSrc:      SpO2: 96% 94%  93%  Weight:      Height:       Eyes: Anicteric no pallor. ENMT: No discharge from the ears eyes nose or mouth. Neck: No mass palpated no neck rigidity no JVD appreciated. Respiratory: No rhonchi or crepitations. Cardiovascular: S1-S2 heard no murmurs appreciated. Abdomen: Soft nontender bowel sounds present. Musculoskeletal: No edema.  No joint effusion. Skin: No rash.  Skin appears warm. Neurologic: Alert awake oriented to time place and person.  Moves all extremities. Psychiatric: Appears normal.  Normal affect.   Labs on Admission: I have personally reviewed following labs and imaging studies  CBC: Recent Labs  Lab 07/19/17 1528  WBC 10.6*  NEUTROABS 7.2  HGB 8.0*  HCT 24.7*  MCV 88.2  PLT 673*   Basic Metabolic Panel: Recent Labs  Lab 07/19/17 1528  NA 139  K 3.4*  CL 108  CO2 23  GLUCOSE 86  BUN 5*  CREATININE 0.55  CALCIUM 9.3   GFR: Estimated Creatinine Clearance: 94.5 mL/min (by C-G formula based on SCr of 0.55 mg/dL). Liver Function Tests: No results for input(s): AST, ALT, ALKPHOS, BILITOT, PROT, ALBUMIN in the last 168 hours. No results for input(s): LIPASE, AMYLASE in the last 168 hours. No results for input(s): AMMONIA in the last 168 hours. Coagulation Profile: No results for input(s): INR, PROTIME in the last 168 hours. Cardiac Enzymes: No results for input(s): CKTOTAL, CKMB, CKMBINDEX, TROPONINI in the last 168 hours. BNP (last 3 results) No results for input(s): PROBNP in the last 8760 hours. HbA1C: No results for input(s): HGBA1C in the last 72 hours. CBG: No results for input(s): GLUCAP in the last 168 hours. Lipid Profile: No results for input(s): CHOL, HDL,  LDLCALC, TRIG, CHOLHDL, LDLDIRECT in the last 72 hours. Thyroid Function Tests: No results for input(s): TSH, T4TOTAL, FREET4, T3FREE, THYROIDAB in the last 72 hours. Anemia Panel: Recent Labs    07/19/17 1528  RETICCTPCT 9.9*   Urine analysis:    Component Value Date/Time   COLORURINE YELLOW 04/30/2017 2055   APPEARANCEUR CLEAR 04/30/2017 2055   LABSPEC 1.005 04/30/2017 2055   PHURINE 7.0 04/30/2017 2055   GLUCOSEU NEGATIVE 04/30/2017 2055   HGBUR SMALL (A) 04/30/2017 2055   BILIRUBINUR NEGATIVE 04/30/2017 2055   KETONESUR NEGATIVE 04/30/2017 2055   PROTEINUR NEGATIVE 04/30/2017 2055   NITRITE NEGATIVE 04/30/2017 2055   LEUKOCYTESUR SMALL (A) 04/30/2017 2055   Sepsis Labs: @LABRCNTIP (procalcitonin:4,lacticidven:4) )No results found for this or any previous visit (from the past 240 hour(s)).   Radiological Exams on Admission: No results found.   Assessment/Plan Principal Problem:   Sickle cell pain crisis ([place]) Active Problems:   Sickle cell anemia (HCC)    1. Sickle cell pain crisis -we will place patient on IV fluids and Dilaudid PCA.  Toradol has been ordered for scheduled dose. 2. Sickle cell anemia -follow CBC.   DVT prophylaxis: Lovenox. Code Status: Full code. Family Communication: Discussed with patient. Disposition Plan: Home. Consults called: None. Admission status: Inpatient.   Rise Patience MD [facility] Pager [phone].  If 7PM-7AM, please contact night-coverage [email or web address] Password [facility]  07/19/2017, 8:38 PM

## 2017-07-20 DIAGNOSIS — D5700 Hb-SS disease with crisis, unspecified: Secondary | ICD-10-CM | POA: Diagnosis not present

## 2017-07-20 MED ORDER — OXYCODONE-ACETAMINOPHEN 5-325 MG PO TABS: 1.0000 | ORAL_TABLET | ORAL | 0 refills | Status: DC | PRN

## 2017-07-20 MED ORDER — DIPHENHYDRAMINE HCL 25 MG PO CAPS
25.0000 mg | ORAL_CAPSULE | Freq: Four times a day (QID) | ORAL | Status: DC | PRN
  Filled 2017-07-20: qty 1

## 2017-07-20 NOTE — Care Management CC44 (Signed)
Condition Code 44 Documentation Completed  Patient Details  Name: Monika Chestang MRN: [id number] Date of Birth: 26-Feb-1990   Condition Code 44 given:  Yes Patient signature on Condition Code 44 notice:  Yes Documentation of 2 MD's agreement:  Yes Code 44 added to claim:  Yes    Dessa Phi, RN 07/20/2017, 11:43 AM

## 2017-07-20 NOTE — Progress Notes (Signed)
Discharge instructions explained to pt. 

## 2017-07-20 NOTE — Progress Notes (Signed)
Pt desatting while in bed down to the 80's Pt started on O2 at 2L via [place] canula. Dr Jonelle Sidle notified. Pt was discharged this AM because she states she needed to pick up her son from her sister's because the sister had to go to work. Pt states there isn't anyone to watch him so has to go home to watch him. Sats checked without O2 sitting ,standing, and ambulating and notified Dr. Jonelle Sidle of the results. The Sats went as low as 69%. Dr. Jonelle Sidle spoke to the patient about not being ready to be discharged, Wendy [name] insisted she was going home to take care of her son. Case manager arranged for the patient to have a tank of Oxygen for her take home until her tank of O2 could be delivered later today. She was instructed on how to use the tank of O2 and left with it on via [place] @ 3L. Sister here to take pt back to her house. PT. left AMA. Prescription given. Discharge instructions explained  To her. Discharged via wheelchair.

## 2017-07-20 NOTE — Discharge Summary (Signed)
Physician Discharge Summary  Patient ID: Wendy [name] MRN: [id number] DOB/AGE: Dec 04, 1990 27 y.o.  Admit date: 07/19/2017 Discharge date: 07/20/2017  Admission Diagnoses:  Discharge Diagnoses:  Principal Problem:   Sickle cell pain crisis ([place]) Active Problems:   Sickle cell anemia ([place])   Discharged Condition: good  Hospital Course: patient was admitted with sickle cell painful crisis.  She was initially complaining of severe pain but later complained that she has social issues at home which triggered her pain.  She was feeling better overnight however she was becoming hypoxic and requiring home oxygen.  Patient was also becoming tachycardic.  Home oxygen was set up with intent to send patient home when she is more stable.  Patient however decided to leave [place].  She is worried about her child at home.  Pain was controlled but we were worried about her hypoxemia as well as tachycardia.  Patient did not wait to complete treatment prior to leaving the hospital.  Consults: None  Significant Diagnostic Studies: labs: CBC is checked as well as CMP  Treatments: IV hydration and analgesia: Dilaudid  Discharge Exam: Blood pressure 106/69, pulse (!) 105, temperature 97.9 F (36.6 C), temperature source Oral, resp. rate 18, height 5\' 6"  (1.676 m), weight 56.7 kg (125 lb), SpO2 (!) 76 %. General appearance: alert, cooperative, appears stated age and no distress Resp: clear to auscultation bilaterally Cardio: regular rate and rhythm, S1, S2 normal, no murmur, click, rub or gallop GI: soft, non-tender; bowel sounds normal; no masses,  no organomegaly Extremities: extremities normal, atraumatic, no cyanosis or edema Pulses: 2+ and symmetric Neurologic: Grossly normal  Disposition: Discharge disposition: 01-Home or Self Care       Discharge Instructions    Diet - low sodium heart healthy   Complete by:  As directed    Increase activity slowly   Complete by:  As  directed      Allergies as of 07/20/2017   No Known Allergies     Medication List    TAKE these medications   acetaminophen 325 MG tablet Commonly known as:  TYLENOL Take 2 tablets (650 mg total) by mouth every 6 (six) hours.   oxyCODONE-acetaminophen 5-325 MG tablet Commonly known as:  PERCOCET/ROXICET Take 1 tablet by mouth every 4 (four) hours as needed for up to 7 days for severe pain.        SignedBarbette Merino 07/20/2017, 7:24 AM

## 2017-07-20 NOTE — Progress Notes (Signed)
Dilaudid PCA discontinued and 13 ml wasted with Nancy Marus.

## 2017-07-20 NOTE — Progress Notes (Signed)
SATURATION QUALIFICATIONS: (This note is used to comply with regulatory documentation for home oxygen)  Patient Saturations on Room Air at Rest = 80%  Patient Saturations on Room Air while Ambulating =70's%  Patient Saturations on  Liters of oxygen while Ambulating =0 %  Please briefly explain why patient needs home oxygen:Patient's O2 level keeps dropping when she is without O2 on

## 2017-07-20 NOTE — Care Management Note (Signed)
Case Management Note  Patient Details  Name: Wendy [name] MRN: [id number] Date of Birth: [birth year]  Subjective/Objective: [facility] has delivered home 02 to rm prior d/c. No further CM needs.                   Action/Plan:d/c home.   Expected Discharge Date:  07/20/17               Expected Discharge Plan:  Home/Self Care  In-House Referral:     Discharge planning Services  CM Consult  Post Acute Care Choice:    Choice offered to:  Patient  DME Arranged:  Oxygen DME Agency:  [place]:    [facility] Agency:     Status of Service:  Completed, signed off  If discussed at [place] of Stay Meetings, dates discussed:    Additional Comments:  Dessa Phi, RN 07/20/2017, 11:47 AM

## 2017-07-20 NOTE — Care Management Obs Status (Signed)
[place] NOTIFICATION   Patient Details  Name: Amayah Staheli MRN: [id number] Date of Birth: Aug 08, 1990   Medicare Observation Status Notification Given:  Yes    Dessa Phi, RN 07/20/2017, 11:43 AM

## 2017-07-20 NOTE — Care Management Note (Signed)
Case Management Note  Patient Details  Name: Wendy [name] MRN: [id number] Date of Birth: 1990/07/02  Subjective/Objective:  Noted potential code 44-alerted UR nurse-Sarah Brown-she will get in touch with covering dr. Jonelle Sidle. Also nurse checking 02 sats-if qualify will need dx, & home 02 order w/liter flow/duration.                  Action/Plan:d/c home.   Expected Discharge Date:  07/20/17               Expected Discharge Plan:  Home/Self Care  In-House Referral:     Discharge planning Services  CM Consult  Post Acute Care Choice:    Choice offered to:     DME Arranged:    DME Agency:     HH Arranged:    HH Agency:     Status of Service:     If discussed at [employer], dates discussed:    Additional Comments:  Dessa Phi, RN 07/20/2017, 11:00 AM

## 2017-07-22 ENCOUNTER — Inpatient Hospital Stay (HOSPITAL_COMMUNITY)
Admission: EM | Admit: 2017-07-22 | Discharge: 2017-07-27 | DRG: 811 | Disposition: A | Discharge status: Home / Self Care | Payer: Medicare Other | Attending: Internal Medicine | Admitting: Internal Medicine

## 2017-07-22 ENCOUNTER — Encounter (HOSPITAL_COMMUNITY): Payer: Self-pay

## 2017-07-22 ENCOUNTER — Emergency Department (HOSPITAL_COMMUNITY): Payer: Medicare Other

## 2017-07-22 ENCOUNTER — Ambulatory Visit: Payer: Medicare Other | Admitting: Family Medicine

## 2017-07-22 ENCOUNTER — Other Ambulatory Visit: Payer: Self-pay

## 2017-07-22 DIAGNOSIS — D5700 Hb-SS disease with crisis, unspecified: Secondary | ICD-10-CM | POA: Diagnosis present

## 2017-07-22 DIAGNOSIS — E876 Hypokalemia: Secondary | ICD-10-CM | POA: Diagnosis present

## 2017-07-22 DIAGNOSIS — Z9981 Dependence on supplemental oxygen: Secondary | ICD-10-CM | POA: Diagnosis not present

## 2017-07-22 DIAGNOSIS — J189 Pneumonia, unspecified organism: Secondary | ICD-10-CM | POA: Diagnosis present

## 2017-07-22 DIAGNOSIS — Z79899 Other long term (current) drug therapy: Secondary | ICD-10-CM | POA: Diagnosis not present

## 2017-07-22 DIAGNOSIS — Z832 Family history of diseases of the blood and blood-forming organs and certain disorders involving the immune mechanism: Secondary | ICD-10-CM

## 2017-07-22 DIAGNOSIS — D5701 Hb-SS disease with acute chest syndrome: Principal | ICD-10-CM | POA: Diagnosis present

## 2017-07-22 DIAGNOSIS — R000 Tachycardia, unspecified: Secondary | ICD-10-CM | POA: Diagnosis present

## 2017-07-22 DIAGNOSIS — G894 Chronic pain syndrome: Secondary | ICD-10-CM | POA: Diagnosis present

## 2017-07-22 DIAGNOSIS — D72829 Elevated white blood cell count, unspecified: Secondary | ICD-10-CM | POA: Diagnosis present

## 2017-07-22 DIAGNOSIS — F17200 Nicotine dependence, unspecified, uncomplicated: Secondary | ICD-10-CM | POA: Diagnosis present

## 2017-07-22 DIAGNOSIS — J9611 Chronic respiratory failure with hypoxia: Secondary | ICD-10-CM | POA: Diagnosis present

## 2017-07-22 DIAGNOSIS — R079 Chest pain, unspecified: Secondary | ICD-10-CM | POA: Diagnosis not present

## 2017-07-22 HISTORY — DX: Hb-SS disease with acute chest syndrome: D57.01

## 2017-07-22 LAB — BASIC METABOLIC PANEL
Anion gap: 9 (ref 5–15)
BUN: 6 mg/dL (ref 6–20)
CALCIUM: 9.5 mg/dL (ref 8.9–10.3)
CO2: 26 mmol/L (ref 22–32)
Chloride: 108 mmol/L (ref 101–111)
Creatinine, Ser: 0.51 mg/dL (ref 0.44–1.00)
GFR calc Af Amer: >60 mL/min (ref >60)
GLUCOSE: 99 mg/dL (ref 65–99)
Potassium: 3 mmol/L — ABNORMAL LOW (ref 3.5–5.1)
Sodium: 143 mmol/L (ref 135–145)

## 2017-07-22 LAB — CBC WITH DIFFERENTIAL/PLATELET
BASOS ABS: 0 10*3/uL (ref 0.0–0.1)
Basophils Relative: 0 %
EOS ABS: 0.1 10*3/uL (ref 0.0–0.7)
Eosinophils Relative: 1 %
HCT: 20.9 % — ABNORMAL LOW (ref 36.0–46.0)
Hemoglobin: 7 g/dL — ABNORMAL LOW (ref 12.0–15.0)
LYMPHS ABS: 3.4 10*3/uL (ref 0.7–4.0)
Lymphocytes Relative: 23 %
MCH: 29 pg (ref 26.0–34.0)
MCHC: 33.5 g/dL (ref 30.0–36.0)
MCV: 86.7 fL (ref 78.0–100.0)
MONO ABS: 1.3 10*3/uL — AB (ref 0.1–1.0)
MONOS PCT: 9 %
Neutro Abs: 10 10*3/uL — ABNORMAL HIGH (ref 1.7–7.7)
Neutrophils Relative %: 67 %
PLATELETS: 415 10*3/uL — AB (ref 150–400)
RBC: 2.41 MIL/uL — AB (ref 3.87–5.11)
RDW: 27.8 % — AB (ref 11.5–15.5)
WBC: 14.8 10*3/uL — AB (ref 4.0–10.5)

## 2017-07-22 LAB — PREPARE RBC (CROSSMATCH)

## 2017-07-22 LAB — RETICULOCYTES
RBC.: 2.41 MIL/uL — AB (ref 3.87–5.11)
RETIC COUNT ABSOLUTE: 551.9 10*3/uL — AB (ref 19.0–186.0)
RETIC CT PCT: 22.9 % — AB (ref 0.4–3.1)

## 2017-07-22 MED ORDER — ACETAMINOPHEN 325 MG PO TABS: 650.0000 mg | ORAL_TABLET | Freq: Once | ORAL | Status: DC

## 2017-07-22 MED ORDER — HYDROMORPHONE HCL 1 MG/ML IJ SOLN
1.0000 mg | INTRAMUSCULAR | Status: AC
  Filled 2017-07-22: qty 1

## 2017-07-22 MED ORDER — POLYETHYLENE GLYCOL 3350 17 G PO PACK
17.0000 g | PACK | Freq: Every day | ORAL | Status: DC | PRN
  Administered 2017-07-26: 17 g via ORAL
  Filled 2017-07-22: qty 1

## 2017-07-22 MED ORDER — ONDANSETRON HCL 4 MG/2ML IJ SOLN
4.0000 mg | INTRAMUSCULAR | Status: DC | PRN
  Administered 2017-07-22 – 2017-07-26 (×5): 4 mg via INTRAVENOUS
  Filled 2017-07-22 (×5): qty 2

## 2017-07-22 MED ORDER — HYDROMORPHONE HCL 1 MG/ML IJ SOLN
1.0000 mg | INTRAMUSCULAR | Status: AC
  Administered 2017-07-22: 1 mg via INTRAVENOUS

## 2017-07-22 MED ORDER — SODIUM CHLORIDE 0.9% FLUSH: 9.0000 mL | INTRAVENOUS | Status: DC | PRN

## 2017-07-22 MED ORDER — DIPHENHYDRAMINE HCL 50 MG/ML IJ SOLN
25.0000 mg | Freq: Once | INTRAMUSCULAR | Status: AC
  Administered 2017-07-22: 25 mg via INTRAVENOUS
  Filled 2017-07-22: qty 1

## 2017-07-22 MED ORDER — KETOROLAC TROMETHAMINE 15 MG/ML IJ SOLN
15.0000 mg | INTRAMUSCULAR | Status: AC
  Administered 2017-07-22: 15 mg via INTRAVENOUS
  Filled 2017-07-22: qty 1

## 2017-07-22 MED ORDER — DEXTROSE-NACL 5-0.45 % IV SOLN
INTRAVENOUS | Status: DC
  Administered 2017-07-22 – 2017-07-27 (×9): via INTRAVENOUS

## 2017-07-22 MED ORDER — KETOROLAC TROMETHAMINE 15 MG/ML IJ SOLN
15.0000 mg | Freq: Four times a day (QID) | INTRAMUSCULAR | Status: DC
  Administered 2017-07-22 – 2017-07-25 (×10): 15 mg via INTRAVENOUS
  Filled 2017-07-22 (×11): qty 1

## 2017-07-22 MED ORDER — HYDROMORPHONE HCL 1 MG/ML IJ SOLN: 1.0000 mg | INTRAMUSCULAR | Status: AC

## 2017-07-22 MED ORDER — POTASSIUM CHLORIDE CRYS ER 20 MEQ PO TBCR
40.0000 meq | EXTENDED_RELEASE_TABLET | Freq: Once | ORAL | Status: DC
  Filled 2017-07-22 (×2): qty 2

## 2017-07-22 MED ORDER — HYDROMORPHONE 1 MG/ML IV SOLN
INTRAVENOUS | Status: DC
  Administered 2017-07-22: 23:00:00 via INTRAVENOUS
  Administered 2017-07-23: 2.5 mL via INTRAVENOUS
  Administered 2017-07-23: 1 mg via INTRAVENOUS
  Administered 2017-07-23: 4.5 mL via INTRAVENOUS
  Administered 2017-07-23: 1.5 mL via INTRAVENOUS
  Administered 2017-07-23: 1.5 mg via INTRAVENOUS
  Administered 2017-07-23: 2 mL via INTRAVENOUS
  Administered 2017-07-24: 1.5 mg via INTRAVENOUS
  Administered 2017-07-24: 3.5 mg via INTRAVENOUS
  Administered 2017-07-24: 2.5 mg via INTRAVENOUS
  Administered 2017-07-24: 1.5 mg via INTRAVENOUS
  Administered 2017-07-24: 2 mg via INTRAVENOUS
  Administered 2017-07-24: 15:00:00 via INTRAVENOUS
  Administered 2017-07-24: 2.5 mg via INTRAVENOUS
  Administered 2017-07-25: 1 mg via INTRAVENOUS
  Administered 2017-07-25: 4 mg via INTRAVENOUS
  Administered 2017-07-25: 2 mg via INTRAVENOUS
  Filled 2017-07-22 (×2): qty 25

## 2017-07-22 MED ORDER — NALOXONE HCL 0.4 MG/ML IJ SOLN: 0.4000 mg | INTRAMUSCULAR | Status: DC | PRN

## 2017-07-22 MED ORDER — HYDROMORPHONE HCL 1 MG/ML IJ SOLN
0.5000 mg | INTRAMUSCULAR | Status: AC
  Administered 2017-07-22: 0.5 mg via INTRAVENOUS
  Filled 2017-07-22: qty 1

## 2017-07-22 MED ORDER — IOPAMIDOL (ISOVUE-370) INJECTION 76%
100.0000 mL | Freq: Once | INTRAVENOUS | Status: AC | PRN
  Administered 2017-07-22: 100 mL via INTRAVENOUS

## 2017-07-22 MED ORDER — SENNOSIDES-DOCUSATE SODIUM 8.6-50 MG PO TABS
1.0000 | ORAL_TABLET | Freq: Two times a day (BID) | ORAL | Status: DC
  Administered 2017-07-23 – 2017-07-26 (×4): 1 via ORAL
  Filled 2017-07-22 (×8): qty 1

## 2017-07-22 MED ORDER — HYDROMORPHONE HCL 1 MG/ML IJ SOLN: 0.5000 mg | INTRAMUSCULAR | Status: AC

## 2017-07-22 MED ORDER — HYDROMORPHONE HCL 1 MG/ML IJ SOLN
1.0000 mg | INTRAMUSCULAR | Status: AC
  Administered 2017-07-22 (×2): 1 mg via INTRAVENOUS
  Filled 2017-07-22 (×2): qty 1

## 2017-07-22 MED ORDER — DIPHENHYDRAMINE HCL 25 MG PO CAPS
25.0000 mg | ORAL_CAPSULE | ORAL | Status: DC | PRN
  Administered 2017-07-25: 25 mg via ORAL
  Filled 2017-07-22: qty 1

## 2017-07-22 MED ORDER — SODIUM CHLORIDE 0.9 % IV SOLN
500.0000 mg | INTRAVENOUS | Status: DC
  Administered 2017-07-22 – 2017-07-26 (×5): 500 mg via INTRAVENOUS
  Filled 2017-07-22 (×5): qty 500

## 2017-07-22 MED ORDER — SODIUM CHLORIDE 0.9 % IV SOLN
1.0000 g | Freq: Once | INTRAVENOUS | Status: AC
  Administered 2017-07-22: 1 g via INTRAVENOUS
  Filled 2017-07-22: qty 10

## 2017-07-22 MED ORDER — HYDROXYUREA 500 MG PO CAPS
500.0000 mg | ORAL_CAPSULE | Freq: Every day | ORAL | Status: DC
  Administered 2017-07-23 – 2017-07-26 (×4): 500 mg via ORAL
  Filled 2017-07-22 (×4): qty 1

## 2017-07-22 MED ORDER — SODIUM CHLORIDE 0.9 % IV SOLN
1.0000 g | INTRAVENOUS | Status: DC
  Administered 2017-07-23 – 2017-07-26 (×4): 1 g via INTRAVENOUS
  Filled 2017-07-22 (×2): qty 1
  Filled 2017-07-22: qty 10
  Filled 2017-07-22 (×2): qty 1

## 2017-07-22 MED ORDER — AZITHROMYCIN 250 MG PO TABS
500.0000 mg | ORAL_TABLET | Freq: Once | ORAL | Status: AC
  Administered 2017-07-22: 500 mg via ORAL
  Filled 2017-07-22: qty 2

## 2017-07-22 MED ORDER — ENOXAPARIN SODIUM 40 MG/0.4ML ~~LOC~~ SOLN
40.0000 mg | SUBCUTANEOUS | Status: DC
  Filled 2017-07-22 (×2): qty 0.4

## 2017-07-22 MED ORDER — SODIUM CHLORIDE 0.9% IV SOLUTION
Freq: Once | INTRAVENOUS | Status: AC
  Administered 2017-07-23: 01:00:00 via INTRAVENOUS

## 2017-07-22 MED ORDER — SODIUM CHLORIDE 0.9 % IV SOLN
25.0000 mg | INTRAVENOUS | Status: DC | PRN
  Filled 2017-07-22: qty 0.5

## 2017-07-22 MED ORDER — IOPAMIDOL (ISOVUE-370) INJECTION 76%
INTRAVENOUS | Status: AC
  Filled 2017-07-22: qty 100

## 2017-07-22 MED ORDER — SODIUM CHLORIDE 0.45 % IV SOLN
INTRAVENOUS | Status: DC
  Administered 2017-07-22: 16:00:00 via INTRAVENOUS

## 2017-07-22 NOTE — ED Notes (Signed)
Pt refusing to leave nasal canula in her nose.

## 2017-07-22 NOTE — ED Notes (Signed)
ED TO INPATIENT HANDOFF REPORT  Name/Age/Gender Wendy [name] 27 y.o. female  Code Status    Code Status Orders  (From admission, onward)        Start     Ordered   07/22/17 2017  Full code  Continuous     07/22/17 2021    Code Status History    Date Active Date Inactive Code Status Order ID Comments User Context   07/19/2017 2036 07/20/2017 1604 Full Code [id number]  Rise Patience, MD ED   05/13/2017 0100 05/18/2017 1638 Full Code [id number]  Merton Border, MD Inpatient   04/30/2017 2301 05/02/2017 1751 Full Code [id number]  Sherene Sires, DO ED   05/28/2016 0004 06/01/2016 1607 Full Code [id number]  Ivor Costa, MD ED      Home/SNF/Other Home  Chief Complaint [place]  Level of Care/Admitting Diagnosis ED Disposition    ED Disposition Condition [place]: [facility] [[id number]]  Level of Care: Telemetry [5]  Admit to tele based on following criteria: Monitor for Ischemic changes  Diagnosis: Acute chest syndrome [facility]) [[id number]]  Admitting Physician: Jani Gravel [[id number]]  Attending Physician: Jani Gravel [phone]  Estimated length of stay: past midnight tomorrow  Certification:: I certify this patient will need inpatient services for at least 2 midnights  PT Class (Do Not Modify): Inpatient [101]  PT Acc Code (Do Not Modify): Private [1]       Medical History Past Medical History:  Diagnosis Date  . Acute chest syndrome ([place]) 07/22/2017  . Anemia   . Sickle cell anemia ([facility])   . Tobacco abuse     Allergies No Known Allergies  IV Location/Drains/Wounds Patient Lines/Drains/Airways Status   Active Line/Drains/Airways    Name:   Placement date:   Placement time:   Site:   Days:   Peripheral IV 07/22/17 Right Antecubital   07/22/17    1600    Antecubital   less than 1   Peripheral IV 07/22/17 Left Antecubital   07/22/17    1808    Antecubital   less than 1          Labs/Imaging Results for orders placed or performed during the  hospital encounter of 07/22/17 (from the past 48 hour(s))  CBC WITH DIFFERENTIAL     Status: Abnormal   Collection Time: 07/22/17  4:06 PM  Result Value Ref Range   WBC 14.8 (H) 4.0 - 10.5 K/uL    Comment: ADJUSTED FOR NUCLEATED RBC'S   RBC 2.41 (L) 3.87 - 5.11 MIL/uL   Hemoglobin 7.0 (L) 12.0 - 15.0 g/dL   HCT 20.9 (L) 36.0 - 46.0 %   MCV 86.7 78.0 - 100.0 fL   MCH 29.0 26.0 - 34.0 pg   MCHC 33.5 30.0 - 36.0 g/dL   RDW 27.8 (H) 11.5 - 15.5 %   Platelets 415 (H) 150 - 400 K/uL   Neutrophils Relative % 67 %   Lymphocytes Relative 23 %   Monocytes Relative 9 %   Eosinophils Relative 1 %   Basophils Relative 0 %   Neutro Abs 10.0 (H) 1.7 - 7.7 K/uL   Lymphs Abs 3.4 0.7 - 4.0 K/uL   Monocytes Absolute 1.3 (H) 0.1 - 1.0 K/uL   Eosinophils Absolute 0.1 0.0 - 0.7 K/uL   Basophils Absolute 0.0 0.0 - 0.1 K/uL   RBC Morphology POLYCHROMASIA PRESENT     Comment: TARGET CELLS Sickle cells present Performed at [facility], [place]  [address] [place], [place] [address]   Reticulocytes     Status: Abnormal   Collection Time: 07/22/17  4:06 PM  Result Value Ref Range   Retic Ct Pct 22.9 (H) 0.4 - 3.1 %   RBC. 2.41 (L) 3.87 - 5.11 MIL/uL   Retic Count, Absolute 551.9 (H) 19.0 - 186.0 K/uL    Comment: Performed at [facility], [place] [address], [place], [place] [address]  Basic metabolic panel     Status: Abnormal   Collection Time: 07/22/17  4:06 PM  Result Value Ref Range   Sodium 143 135 - 145 mmol/L   Potassium 3.0 (L) 3.5 - 5.1 mmol/L   Chloride 108 101 - 111 mmol/L   CO2 26 22 - 32 mmol/L   Glucose, Bld 99 65 - 99 mg/dL   BUN 6 6 - 20 mg/dL   Creatinine, Ser 0.51 0.44 - 1.00 mg/dL   Calcium 9.5 8.9 - 10.3 mg/dL   GFR calc non Af Amer >60 >60 mL/min   GFR calc Af Amer >60 >60 mL/min    Comment: (NOTE) The eGFR has been calculated using the CKD EPI equation. This calculation has not been validated in all clinical situations. eGFR's persistently  <60 mL/min signify possible Chronic Kidney Disease.    Anion gap 9 5 - 15    Comment: Performed at [facility], [place] [address], [place], [place] [address]   Ct Angio Chest Pe W And/or Wo Contrast  Result Date: 07/22/2017 CLINICAL DATA:  Dyspnea and body pain x2 days. History of sickle cell anemia and sickle cell crisis. EXAM: CT ANGIOGRAPHY CHEST WITH CONTRAST TECHNIQUE: Multidetector CT imaging of the chest was performed using the standard protocol during bolus administration of intravenous contrast. Multiplanar CT image reconstructions and MIPs were obtained to evaluate the vascular anatomy. CONTRAST:  150m ISOVUE-370 IOPAMIDOL (ISOVUE-370) INJECTION 76% COMPARISON:  05/19/2017 FINDINGS: Cardiovascular: The study is of quality for the evaluation of pulmonary embolism. There are no filling defects in the central, lobar, segmental or subsegmental pulmonary artery branches to suggest acute pulmonary embolism. Cardiomegaly is redemonstrated without evidence of pericardial effusion. Motion related artifacts limit assessment of the aortic root and ascending aorta. No aneurysmal dilatation is identified. No definite evidence of dissection given motion artifacts. Mediastinum/Nodes: No discrete thyroid nodules. Unremarkable esophagus. No pathologically enlarged axillary, mediastinal or hilar lymph nodes. Lungs/Pleura: No pneumothorax. No pleural effusion. Multilobar airspace opacities most of which are ground-glass in appearance are identified and more confluent in both lower lobes. Findings may represent stigmata of acute chest syndrome given history of sickle cell disease. Alternatively, multilobar pneumonia is a possibility as well. No effusion or pneumothorax. Upper abdomen: Unremarkable. Musculoskeletal: No aggressive appearing focal osseous lesions. 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 Review of the MIP images confirms the above findings. IMPRESSION: 1. Cardiomegaly with H-shaped thoracic vertebrae in keeping with stigmata from patient's history of sickle cell disease. 2. Diffuse multilobar but lower lobe predominant ground-glass opacities suspicious for acute chest syndrome. Alternatively, multilobar pneumonia is a possibility. 3. No acute pulmonary embolus. Electronically Signed   By: DAshley RoyaltyM.D.   On: 07/22/2017 18:43    Pending Labs Unresulted Labs (From admission, onward)   Start     Ordered   07/29/17 0500  Creatinine, serum  (enoxaparin (LOVENOX)    CrCl >/= 30 ml/min)  Weekly,   R    Comments:  while on enoxaparin therapy    07/22/17 2021   07/23/17 0500  Comprehensive metabolic panel  Tomorrow morning,   R     07/22/17 2021   07/23/17 0500  CBC  Tomorrow morning,   R     07/22/17 2021   07/22/17 2021  Prepare RBC  (Adult Blood Administration - Red Blood Cells)  Once,   R    Question Answer Comment  # of Units 1 unit   Transfusion Indications Symptomatic Anemia   If emergent release call blood bank Not emergent release      07/22/17 2021   07/22/17 2020  Type and screen [place]  Once,   R    Comments:  [place]    07/22/17 2021   07/22/17 2017  HIV antibody (Routine Testing)  Once,   R     07/22/17 2021      Vitals/Pain Today's Vitals   07/22/17 1607 07/22/17 1615 07/22/17 1949 07/22/17 1957  BP: 115/68 112/70  113/69  Pulse: (!) 108 99  (!) 102  Resp: (!) _0 Temp:      TempSrc:      SpO2: (!) 89% 97%  92%  PainSc:   10-Worst pain ever     Isolation Precautions No active isolations  Medications Medications  HYDROmorphone (DILAUDID) injection 1 mg (has no administration in time range)    Or  HYDROmorphone (DILAUDID) injection 1 mg (has no administration in time range)  ondansetron (ZOFRAN) injection 4 mg (4 mg Intravenous Given  07/22/17 2005)  potassium chloride SA (K-DUR,KLOR-CON) CR tablet 40 mEq (40 mEq Oral Refused 07/22/17 1730)  iopamidol (ISOVUE-370) 76 % injection (has no administration in time range)  cefTRIAXone (ROCEPHIN) 1 g in sodium chloride 0.9 % 100 mL IVPB (has no administration in time range)  azithromycin (ZITHROMAX) 500 mg in sodium chloride 0.9 % 250 mL IVPB (has no administration in time range)  senna-docusate (Senokot-S) tablet 1 tablet (has no administration in time range)  polyethylene glycol (MIRALAX / GLYCOLAX) packet 17 g (has no administration in time range)  enoxaparin (LOVENOX) injection 40 mg (has no administration in time range)  dextrose 5 %-0.45 % sodium chloride infusion (has no administration in time range)  0.9 %  sodium chloride infusion (Manually program via Guardrails IV Fluids) (has no administration in time range)  ketorolac (TORADOL) 15 MG/ML injection 15 mg (has no administration in time range)  acetaminophen (TYLENOL) tablet 650 mg (has no administration in time range)  diphenhydrAMINE (BENADRYL) injection 25 mg (has no administration in time range)  hydroxyurea (DROXIA) capsule 900 mg (has no administration in time range)  naloxone (NARCAN) injection 0.4 mg (has no administration in time range)    And  sodium chloride flush (NS) 0.9 % injection 9 mL (has no administration in time range)  diphenhydrAMINE (BENADRYL) capsule 25 mg (has no administration in time range)    Or  diphenhydrAMINE (BENADRYL) 25 mg in sodium chloride 0.9 % 50 mL IVPB (has no administration in time range)  HYDROmorphone (DILAUDID) 1 mg/mL PCA injection (has no administration in time range)  ketorolac (TORADOL) 15 MG/ML injection 15 mg (15 mg Intravenous Given 07/22/17 1601)  HYDROmorphone (DILAUDID) injection 0.5 mg (0.5 mg Intravenous Given 07/22/17 1601)    Or  HYDROmorphone (DILAUDID) injection 0.5 mg ( Subcutaneous [name] Alternative 07/22/17 1601)  HYDROmorphone (DILAUDID) injection 1 mg (1 mg  Intravenous  Given 07/22/17 1653)    Or  HYDROmorphone (DILAUDID) injection 1 mg ( Subcutaneous [name] Alternative 07/22/17 1653)  HYDROmorphone (DILAUDID) injection 1 mg (1 mg Intravenous Given 07/22/17 1951)    Or  HYDROmorphone (DILAUDID) injection 1 mg ( Subcutaneous [name] Alternative 07/22/17 1951)  diphenhydrAMINE (BENADRYL) injection 25 mg (25 mg Intravenous Given 07/22/17 1559)  iopamidol (ISOVUE-370) 76 % injection 100 mL (100 mLs Intravenous Contrast Given 07/22/17 1819)  cefTRIAXone (ROCEPHIN) 1 g in sodium chloride 0.9 % 100 mL IVPB (1 g Intravenous New Bag/Given 07/22/17 2005)  azithromycin (ZITHROMAX) tablet 500 mg (500 mg Oral Given 07/22/17 2006)    Mobility walks

## 2017-07-22 NOTE — ED Provider Notes (Addendum)
[place] DEPT Provider Note   CSN: [id number] Arrival date & time: 07/22/17  1423   History   Chief Complaint Chief Complaint  Patient presents with  . Sickle Cell Pain Crisis  . Shortness of Breath    HPI Wendy [name] is a 27 y.o. female.   Sickle Cell Pain Crisis  Location:  Diffuse Severity:  Moderate Onset quality:  Gradual Duration:  2 days Similar to previous crisis episodes: yes   Timing:  Constant Frequency of attacks:  Pt was just admitted to the hospital a couple of days ago.  She left AMA 6/17 Context: not alcohol consumption and not infection   Relieved by:  Nothing Worsened by:  Nothing Ineffective treatments:  None tried Associated symptoms: chest pain and shortness of breath   Associated symptoms: no congestion, no cough, no fever and no swelling of legs   Risk factors: frequent pain crises   Risk factors: no hx of pneumonia   Risk factors comment:  Denies history of PE or acute chest syndrome.  Pt states she is hurting all over and is having trouble breathing.  She has a history of sickle cell disease.  No cough.  The pain does not increase with deep breathing or movement.  No hx of acute chest syndrome or PE.  Pt states she does not have a doctor for her sickle cell and does not take any medications.  No fever, or diarrhea.  She has been vomiting.    She left the hospital AMA a couple of days ago.  Pt was noted to be hypoxic previous visit. Past Medical History:  Diagnosis Date  . Anemia   . Sickle cell anemia (HCC)   . Tobacco abuse     Patient Active Problem List   Diagnosis Date Noted  . Sickle cell crisis ([place]) 05/12/2017  . Sickle cell pain crisis ([place]) 05/27/2016  . Leukocytosis 05/27/2016  . Sickle cell anemia (HCC)     Past Surgical History:  Procedure Laterality Date  . CESAREAN SECTION    . CESAREAN SECTION WITH BILATERAL TUBAL LIGATION       OB History   None      Home Medications     Prior to Admission medications   Medication Sig Start Date End Date Taking? Authorizing Provider  oxyCODONE-acetaminophen (PERCOCET/ROXICET) 5-325 MG tablet Take 1 tablet by mouth every 4 (four) hours as needed for up to 7 days for severe pain. 07/20/17 07/27/17 Yes Elwyn Reach, MD  acetaminophen (TYLENOL) 325 MG tablet Take 2 tablets (650 mg total) by mouth every 6 (six) hours. Patient not taking: Reported on 07/22/2017 05/02/17   Verner Mould, MD    Family History Family History  Problem Relation Age of Onset  . Sickle cell trait Father     Social History Social History   Tobacco Use  . Smoking status: Never Smoker  . Smokeless tobacco: Never Used  Substance Use Topics  . Alcohol use: No  . Drug use: No     Allergies   Patient has no known allergies.   Review of Systems Review of Systems  Constitutional: Negative for fever.  HENT: Negative for congestion.   Respiratory: Positive for shortness of breath. Negative for cough.   Cardiovascular: Positive for chest pain.  All other systems reviewed and are negative.    Physical Exam Updated Vital Signs BP 112/70   Pulse 99   Temp 98.2 F (36.8 C) (Oral)   Resp 18  LMP 07/21/2017   SpO2 97%   Physical Exam  Constitutional: She appears well-developed and well-nourished. No distress.  Appears comfortable, using phone  HENT:  Head: Normocephalic and atraumatic.  Right Ear: External ear normal.  Left Ear: External ear normal.  Eyes: Conjunctivae are normal. Right eye exhibits no discharge. Left eye exhibits no discharge. No scleral icterus.  Neck: Neck supple. No tracheal deviation present.  Cardiovascular: Normal rate, regular rhythm and intact distal pulses.  Pulmonary/Chest: Effort normal and breath sounds normal. No stridor. No respiratory distress. She has no wheezes. She has no rales.  Abdominal: Soft. Bowel sounds are normal. She exhibits no distension. There is no tenderness. There is no  rebound and no guarding.  Musculoskeletal: She exhibits no edema or tenderness.  Neurological: She is alert. She has normal strength. No cranial nerve deficit (no facial droop, extraocular movements intact, no slurred speech) or sensory deficit. She exhibits normal muscle tone. She displays no seizure activity. Coordination normal.  Skin: Skin is warm and dry. No rash noted.  Psychiatric: She has a normal mood and affect.  Nursing note and vitals reviewed.    ED Treatments / Results  Labs (all labs ordered are listed, but only abnormal results are displayed) Labs Reviewed  CBC WITH DIFFERENTIAL/PLATELET - Abnormal; Notable for the following components:      Result Value   WBC 14.8 (*)    RBC 2.41 (*)    Hemoglobin 7.0 (*)    HCT 20.9 (*)    RDW 27.8 (*)    Platelets 415 (*)    Neutro Abs 10.0 (*)    Monocytes Absolute 1.3 (*)    All other components within normal limits  RETICULOCYTES - Abnormal; Notable for the following components:   Retic Ct Pct 22.9 (*)    RBC. 2.41 (*)    Retic Count, Absolute 551.9 (*)    All other components within normal limits  BASIC METABOLIC PANEL - Abnormal; Notable for the following components:   Potassium 3.0 (*)    All other components within normal limits    EKG None  Radiology Ct Angio Chest Pe W And/or Wo Contrast  Result Date: 07/22/2017 CLINICAL DATA:  Dyspnea and body pain x2 days. History of sickle cell anemia and sickle cell crisis. EXAM: CT ANGIOGRAPHY CHEST WITH CONTRAST TECHNIQUE: Multidetector CT imaging of the chest was performed using the standard protocol during bolus administration of intravenous contrast. Multiplanar CT image reconstructions and MIPs were obtained to evaluate the vascular anatomy. CONTRAST:  156mL ISOVUE-370 IOPAMIDOL (ISOVUE-370) INJECTION 76% COMPARISON:  05/19/2017 FINDINGS: Cardiovascular: The study is of quality for the evaluation of pulmonary embolism. There are no filling defects in the central, lobar,  segmental or subsegmental pulmonary artery branches to suggest acute pulmonary embolism. Cardiomegaly is redemonstrated without evidence of pericardial effusion. Motion related artifacts limit assessment of the aortic root and ascending aorta. No aneurysmal dilatation is identified. No definite evidence of dissection given motion artifacts. Mediastinum/Nodes: No discrete thyroid nodules. Unremarkable esophagus. No pathologically enlarged axillary, mediastinal or hilar lymph nodes. Lungs/Pleura: No pneumothorax. No pleural effusion. Multilobar airspace opacities most of which are ground-glass in appearance are identified and more confluent in both lower lobes. Findings may represent stigmata of acute chest syndrome given history of sickle cell disease. Alternatively, multilobar pneumonia is a possibility as well. No effusion or pneumothorax. Upper abdomen: Unremarkable. Musculoskeletal: No aggressive appearing focal osseous lesions. 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 Review of the MIP images confirms the above findings. IMPRESSION: 1. Cardiomegaly with H-shaped thoracic vertebrae in keeping with stigmata from patient's history of sickle cell disease. 2. Diffuse multilobar but lower lobe predominant ground-glass opacities suspicious for acute chest syndrome. Alternatively, multilobar pneumonia is a possibility. 3. No acute pulmonary embolus. Electronically Signed   By: Ashley Royalty M.D.   On: 07/22/2017 18:43    Procedures .Critical Care Performed by: Dorie Rank, MD Authorized by: Dorie Rank, MD   Critical care provider statement:    Critical care time (minutes):  35   Critical care was time spent personally by me on the following activities:  Discussions with consultants, evaluation of patient's response to treatment, examination of patient, ordering and  performing treatments and interventions, ordering and review of laboratory studies, ordering and review of radiographic studies, pulse oximetry, re-evaluation of patient's condition, obtaining history from patient or surrogate and review of old charts   (including critical care time)  Medications Ordered in ED Medications  0.45 % sodium chloride infusion ( Intravenous New Bag/Given 07/22/17 1607)  HYDROmorphone (DILAUDID) injection 1 mg (has no administration in time range)    Or  HYDROmorphone (DILAUDID) injection 1 mg (has no administration in time range)  HYDROmorphone (DILAUDID) injection 1 mg (1 mg Intravenous Given 07/22/17 1750)    Or  HYDROmorphone (DILAUDID) injection 1 mg ( Subcutaneous See Alternative 07/22/17 1750)  ondansetron (ZOFRAN) injection 4 mg (4 mg Intravenous Given 07/22/17 1557)  potassium chloride SA (K-DUR,KLOR-CON) CR tablet 40 mEq (40 mEq Oral Refused 07/22/17 1730)  iopamidol (ISOVUE-370) 76 % injection (has no administration in time range)  cefTRIAXone (ROCEPHIN) 1 g in sodium chloride 0.9 % 100 mL IVPB (has no administration in time range)  azithromycin (ZITHROMAX) tablet 500 mg (has no administration in time range)  ketorolac (TORADOL) 15 MG/ML injection 15 mg (15 mg Intravenous Given 07/22/17 1601)  HYDROmorphone (DILAUDID) injection 0.5 mg (0.5 mg Intravenous Given 07/22/17 1601)    Or  HYDROmorphone (DILAUDID) injection 0.5 mg ( Subcutaneous See Alternative 07/22/17 1601)  HYDROmorphone (DILAUDID) injection 1 mg (1 mg Intravenous Given 07/22/17 1653)    Or  HYDROmorphone (DILAUDID) injection 1 mg ( Subcutaneous See Alternative 07/22/17 1653)  diphenhydrAMINE (BENADRYL) injection 25 mg (25 mg Intravenous Given 07/22/17 1559)  iopamidol (ISOVUE-370) 76 % injection 100 mL (100 mLs Intravenous Contrast Given 07/22/17 1819)     Initial Impression / Assessment and Plan / ED Course  I have reviewed the triage vital signs and the nursing notes.  Pertinent labs & imaging  results that were available during my care of the patient were reviewed by me and considered in my medical decision making (see chart for details).  Clinical Course as of Jul 22 1908  Wed Jul 22, 2017  1644 CXR on 6/16 negative   [JK]  1644 HCG negative on 6/16   [JK]  1716 Previous CXR without pna.  O2 sat still borderline low.  Will ct to evaluate for PE   [JK]    Clinical Course User Index [JK] Dorie Rank, MD    Patient presented to the emergency room for evaluation of persistent sickle cell pain crisis and shortness of breath.  Patient's ED work-up is notable for a CT scan suggestive of acute chest syndrome.  Patient continues to have pain.  IV antibiotics were started.  Plan on admission to the hospital for further treatment.  Final  Clinical Impressions(s) / ED Diagnoses   Final diagnoses:  Acute chest syndrome ([place])  Sickle cell pain crisis [facility])      Dorie Rank, MD 07/22/17 1912 Cc addendum   Dorie Rank, MD 07/30/17 1446

## 2017-07-22 NOTE — ED Notes (Signed)
Pt's O2 Sat at 88% Walked into pt's room and pt had taken herself off of her [place] Pt placed back on [place] at 2 LPM

## 2017-07-22 NOTE — ED Notes (Signed)
Warm blanket and Coke given

## 2017-07-22 NOTE — ED Notes (Signed)
Pt intermittently taking nasal cannula off. Pt advised to leave it on.

## 2017-07-22 NOTE — H&P (Signed)
[facility] H&P   Patient Demographics:    Wendy [name], is a 27 y.o. female  MRN: [id number]   DOB - 04/09/1990  Admit Date - 07/22/2017  Outpatient Primary MD for the patient is Patient, No Pcp Per  Referring MD/NP/PA: Marye Round  Outpatient Specialists:     Patient coming from: home  Chief Complaint  Patient presents with  . Sickle Cell Pain Crisis  . Shortness of Breath      HPI:    Wendy [name]  is a 27 y.o. female, w sickle cell anemia, who was recently admitted and left AMA who now presents with continued generalized pain as well as pain over her right and left chest .  Slight dyspnea.  Pt denies fever, chills, cough, palp, n/v, diarrhea, brbpr.    In Ed,  CTA chest  IMPRESSION: 1. Cardiomegaly with H-shaped thoracic vertebrae in keeping with stigmata from patient's history of sickle cell disease. 2. Diffuse multilobar but lower lobe predominant ground-glass opacities suspicious for acute chest syndrome. Alternatively, multilobar pneumonia is a possibility. 3. No acute pulmonary embolus.  Wbc 14.8, Hgb 7.0, Plt 415 retic 22.9 Na 143, K 3.0, Bun 6, Creatinine 0.51  Pt tx with dilaudid x multiple doses without relief along with o2, and ns iv, and given potassium chloride 40 meq po x1  Pt will be admitted for sickle cell crisis as well as acute chest syndrome   Review of systems:    In addition to the HPI above,  No Fever-chills, No Headache, No changes with Vision or hearing, No problems swallowing food or Liquids,  No Abdominal pain, No Nausea or Vommitting, Bowel movements are regular, No Blood in stool or Urine, No dysuria, No new skin rashes or bruises, No new joints pains-aches,  No new weakness, tingling, numbness in any extremity, No recent weight gain or loss, No polyuria, polydypsia or polyphagia, No significant Mental Stressors.  A full 10  point Review of Systems was done, except as stated above, all other Review of Systems were negative.   With Past History of the following :    Past Medical History:  Diagnosis Date  . Anemia   . Sickle cell anemia (HCC)   . Tobacco abuse       Past Surgical History:  Procedure Laterality Date  . CESAREAN SECTION    . CESAREAN SECTION WITH BILATERAL TUBAL LIGATION        Social History:     Social History   Tobacco Use  . Smoking status: Never Smoker  . Smokeless tobacco: Never Used  Substance Use Topics  . Alcohol use: No     Lives - at home  Mobility - walks by self   Family History :     Family History  Problem Relation Age of Onset  . Sickle cell trait Father        Home Medications:  Prior to Admission medications   Medication Sig Start Date End Date Taking? Authorizing Provider  oxyCODONE-acetaminophen (PERCOCET/ROXICET) 5-325 MG tablet Take 1 tablet by mouth every 4 (four) hours as needed for up to 7 days for severe pain. 07/20/17 07/27/17 Yes Elwyn Reach, MD  acetaminophen (TYLENOL) 325 MG tablet Take 2 tablets (650 mg total) by mouth every 6 (six) hours. Patient not taking: Reported on 07/22/2017 05/02/17   Verner Mould, MD     Allergies:    No Known Allergies   Physical Exam:   Vitals  Blood pressure 113/69, pulse (!) 102, temperature 98.2 F (36.8 C), temperature source Oral, resp. rate 15, last menstrual period 07/21/2017, SpO2 92 %.   1. General lying in bed in NAD,   2. Normal affect and insight, Not Suicidal or Homicidal, Awake Alert, Oriented X 3.  3. No F.N deficits, ALL C.Nerves Intact, Strength 5/5 all 4 extremities, Sensation intact all 4 extremities, Plantars down going.  4. Ears and Eyes appear Normal, Conjunctivae clear, PERRLA. Moist Oral Mucosa.  5. Supple Neck, No JVD, No cervical lymphadenopathy appriciated, No Carotid Bruits.  6. Symmetrical Chest wall movement, Good air movement bilaterally,  CTAB.  7. RRR, No Gallops, Rubs or Murmurs, No Parasternal Heave.  8. Positive Bowel Sounds, Abdomen Soft, No tenderness, No organomegaly appriciated,No rebound -guarding or rigidity.  9.  No Cyanosis, Normal Skin Turgor, No Skin Rash or Bruise.  10. Good muscle tone,  joints appear normal , no effusions, Normal ROM.  11. No Palpable Lymph Nodes in Neck or Axillae      Data Review:    CBC Recent Labs  Lab 07/19/17 1528 07/22/17 1606  WBC 10.6* 14.8*  HGB 8.0* 7.0*  HCT 24.7* 20.9*  PLT 468* 415*  MCV 88.2 86.7  MCH 28.6 29.0  MCHC 32.4 33.5  RDW 18.8* 27.8*  LYMPHSABS 2.4 3.4  MONOABS 0.7 1.3*  EOSABS 0.3 0.1  BASOSABS 0.1 0.0   ------------------------------------------------------------------------------------------------------------------  Chemistries  Recent Labs  Lab 07/19/17 1528 07/22/17 1606  NA 139 143  K 3.4* 3.0*  CL 108 108  CO2 23 26  GLUCOSE 86 99  BUN 5* 6  CREATININE 0.55 0.51  CALCIUM 9.3 9.5   ------------------------------------------------------------------------------------------------------------------ estimated creatinine clearance is 94.5 mL/min (by C-G formula based on SCr of 0.51 mg/dL). ------------------------------------------------------------------------------------------------------------------ No results for input(s): TSH, T4TOTAL, T3FREE, THYROIDAB in the last 72 hours.  Invalid input(s): FREET3  Coagulation profile No results for input(s): INR, PROTIME in the last 168 hours. ------------------------------------------------------------------------------------------------------------------- No results for input(s): DDIMER in the last 72 hours. -------------------------------------------------------------------------------------------------------------------  Cardiac Enzymes No results for input(s): CKMB, TROPONINI, MYOGLOBIN in the last 168 hours.  Invalid input(s):  CK ------------------------------------------------------------------------------------------------------------------ No results found for: BNP   ---------------------------------------------------------------------------------------------------------------  Urinalysis    Component Value Date/Time   COLORURINE YELLOW 04/30/2017 2055   APPEARANCEUR CLEAR 04/30/2017 2055   LABSPEC 1.005 04/30/2017 2055   PHURINE 7.0 04/30/2017 2055   GLUCOSEU NEGATIVE 04/30/2017 2055   HGBUR SMALL (A) 04/30/2017 2055   BILIRUBINUR NEGATIVE 04/30/2017 2055   [place] NEGATIVE 04/30/2017 2055   PROTEINUR NEGATIVE 04/30/2017 2055   NITRITE NEGATIVE 04/30/2017 2055   LEUKOCYTESUR SMALL (A) 04/30/2017 2055    ----------------------------------------------------------------------------------------------------------------   Imaging Results:    Ct Angio Chest Pe W And/or Wo Contrast  Result Date: 07/22/2017 CLINICAL DATA:  Dyspnea and body pain x2 days. History of sickle cell anemia and sickle cell crisis. EXAM: CT ANGIOGRAPHY CHEST WITH CONTRAST TECHNIQUE: Multidetector CT imaging  of the chest was performed using the standard protocol during bolus administration of intravenous contrast. Multiplanar CT image reconstructions and MIPs were obtained to evaluate the vascular anatomy. CONTRAST:  153mL ISOVUE-370 IOPAMIDOL (ISOVUE-370) INJECTION 76% COMPARISON:  05/19/2017 FINDINGS: Cardiovascular: The study is of quality for the evaluation of pulmonary embolism. There are no filling defects in the central, lobar, segmental or subsegmental pulmonary artery branches to suggest acute pulmonary embolism. Cardiomegaly is redemonstrated without evidence of pericardial effusion. Motion related artifacts limit assessment of the aortic root and ascending aorta. No aneurysmal dilatation is identified. No definite evidence of dissection given motion artifacts. Mediastinum/Nodes: No discrete thyroid nodules. Unremarkable  esophagus. No pathologically enlarged axillary, mediastinal or hilar lymph nodes. Lungs/Pleura: No pneumothorax. No pleural effusion. Multilobar airspace opacities most of which are ground-glass in appearance are identified and more confluent in both lower lobes. Findings may represent stigmata of acute chest syndrome given history of sickle cell disease. Alternatively, multilobar pneumonia is a possibility as well. No effusion or pneumothorax. Upper abdomen: Unremarkable. Musculoskeletal: No aggressive appearing focal osseous lesions. 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 Review of the MIP images confirms the above findings. IMPRESSION: 1. Cardiomegaly with H-shaped thoracic vertebrae in keeping with stigmata from patient's history of sickle cell disease. 2. Diffuse multilobar but lower lobe predominant ground-glass opacities suspicious for acute chest syndrome. Alternatively, multilobar pneumonia is a possibility. 3. No acute pulmonary embolus. Electronically Signed   By: Ashley Royalty M.D.   On: 07/22/2017 18:43       Assessment & Plan:    Active Problems:   Acute chest syndrome (HCC)    Acute chest syndrome/ dyspnea Tele o2 [place] Hydrate with d5ns Transfuse with 1 unit prbc Rocephin 1gm iv qday (we don't have cefotaxime, slight risk of hemolysis with rocephin) zithromax 500mg  iv qday  Sickle cell crisis Hydrate with d5ns iv Dilaudid pca iv Start hydroxyurea 15mg /kg to prevent acute chest  Anemia Transfuse Repeat cbc in am  Hypokalemia Replete Check cmp in am      DVT Prophylaxis Lovenox - SCDs  AM Labs Ordered, also please review Full Orders  Family Communication: Admission, patients condition and plan of care including tests being ordered have been discussed with the patient  who indicate understanding and agree with the plan  and Code Status.  Code Status FULL CODE  Likely DC to  *home  Condition GUARDED    Consults called: none  Admission status: inpatient   Time spent in minutes : 45   Jani Gravel M.D on 07/22/2017 at 8:32 PM  Between 7am to 7pm - Pager - [phone]. After 7pm go to [email or web address] - password [facility]  [facility] - Office  [phone]

## 2017-07-22 NOTE — ED Triage Notes (Signed)
Pt arrives c/o shortness of breath and full ody pain x2 days. Per EMS pt was seen in hospital 2 days ago and left AMA. Pt uses home O2 2L/min [place]. 100% for EMS on 2L/min [place].

## 2017-07-23 LAB — COMPREHENSIVE METABOLIC PANEL
ALT: 53 U/L (ref 14–54)
AST: 64 U/L — AB (ref 15–41)
Albumin: 3.9 g/dL (ref 3.5–5.0)
Alkaline Phosphatase: 39 U/L (ref 38–126)
Anion gap: 7 (ref 5–15)
BILIRUBIN TOTAL: 1.7 mg/dL — AB (ref 0.3–1.2)
BUN: 5 mg/dL — AB (ref 6–20)
CHLORIDE: 109 mmol/L (ref 101–111)
CO2: 25 mmol/L (ref 22–32)
Calcium: 8.2 mg/dL — ABNORMAL LOW (ref 8.9–10.3)
Creatinine, Ser: 0.5 mg/dL (ref 0.44–1.00)
GFR calc Af Amer: >60 mL/min (ref >60)
Glucose, Bld: 122 mg/dL — ABNORMAL HIGH (ref 65–99)
Potassium: 3.4 mmol/L — ABNORMAL LOW (ref 3.5–5.1)
Sodium: 141 mmol/L (ref 135–145)
Total Protein: 6.5 g/dL (ref 6.5–8.1)

## 2017-07-23 LAB — CBC
HCT: 20.9 % — ABNORMAL LOW (ref 36.0–46.0)
Hemoglobin: 7.3 g/dL — ABNORMAL LOW (ref 12.0–15.0)
MCH: 30.2 pg (ref 26.0–34.0)
MCHC: 34.9 g/dL (ref 30.0–36.0)
MCV: 86.4 fL (ref 78.0–100.0)
Platelets: 359 10*3/uL (ref 150–400)
RBC: 2.42 MIL/uL — ABNORMAL LOW (ref 3.87–5.11)
RDW: 24.1 % — AB (ref 11.5–15.5)
WBC: 12.3 10*3/uL — AB (ref 4.0–10.5)

## 2017-07-23 LAB — HIV ANTIBODY (ROUTINE TESTING W REFLEX): HIV SCREEN 4TH GENERATION: NONREACTIVE

## 2017-07-23 MED ORDER — POTASSIUM CHLORIDE CRYS ER 20 MEQ PO TBCR
40.0000 meq | EXTENDED_RELEASE_TABLET | Freq: Once | ORAL | Status: AC
  Administered 2017-07-23: 40 meq via ORAL
  Filled 2017-07-23: qty 2

## 2017-07-23 NOTE — Progress Notes (Signed)
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  She came back with acute chest syndrome.  Patient also having significant pain.  She is on Dilaudid PCA was Toradol and IV fluids.  Has leukocytosis.  Currently on antibiotics.  Objective: Vital signs in last 24 hours: Temp:  [98 F (36.7 C)-98.6 F (37 C)] 98 F (36.7 C) (06/20 1026) Pulse Rate:  [73-108] 78 (06/20 1026) Resp:  [10-23] 11 (06/20 1148) BP: (104-121)/(64-80) 104/64 (06/20 1026) SpO2:  [89 %-100 %] 100 % (06/20 1148) Weight:  [60.2 kg (132 lb 11.5 oz)] 60.2 kg (132 lb 11.5 oz) (06/20 0200) Weight change:     Intake/Output from previous day: 06/19 0701 - 06/20 0700 In: 2082.7 [I.V.:1452.5; Blood:376; IV Piggyback:254.2] Out: -  Intake/Output this shift: Total I/O In: 200 [P.O.:200] Out: -   General appearance: alert, cooperative, appears stated age and no distress Neck: no adenopathy, no carotid bruit, no JVD, supple, symmetrical, trachea midline and thyroid not enlarged, symmetric, no tenderness/mass/nodules Resp: clear to auscultation bilaterally Cardio: regular rate and rhythm, S1, S2 normal, no murmur, click, rub or gallop GI: soft, non-tender; bowel sounds normal; no masses,  no organomegaly Extremities: extremities normal, atraumatic, no cyanosis or edema Pulses: 2+ and symmetric Skin: Skin color, texture, turgor normal. No rashes or lesions Neurologic: Grossly normal  Lab Results: Recent Labs    07/22/17 1606 07/23/17 0733  WBC 14.8* 12.3*  HGB 7.0* 7.3*  HCT 20.9* 20.9*  PLT 415* 359   BMET Recent Labs    07/22/17 1606 07/23/17 0733  NA 143 141  K 3.0* 3.4*  CL 108 109  CO2 26 25  GLUCOSE 99 122*  BUN 6 5*  CREATININE 0.51 0.50  CALCIUM 9.5 8.2*    Studies/Results: Ct Angio Chest Pe W And/or Wo Contrast  Result Date: 07/22/2017 CLINICAL DATA:  Dyspnea and body pain x2 days. History of sickle cell anemia  and sickle cell crisis. EXAM: CT ANGIOGRAPHY CHEST WITH CONTRAST TECHNIQUE: Multidetector CT imaging of the chest was performed using the standard protocol during bolus administration of intravenous contrast. Multiplanar CT image reconstructions and MIPs were obtained to evaluate the vascular anatomy. CONTRAST:  181mL ISOVUE-370 IOPAMIDOL (ISOVUE-370) INJECTION 76% COMPARISON:  05/19/2017 FINDINGS: Cardiovascular: The study is of quality for the evaluation of pulmonary embolism. There are no filling defects in the central, lobar, segmental or subsegmental pulmonary artery branches to suggest acute pulmonary embolism. Cardiomegaly is redemonstrated without evidence of pericardial effusion. Motion related artifacts limit assessment of the aortic root and ascending aorta. No aneurysmal dilatation is identified. No definite evidence of dissection given motion artifacts. Mediastinum/Nodes: No discrete thyroid nodules. Unremarkable esophagus. No pathologically enlarged axillary, mediastinal or hilar lymph nodes. Lungs/Pleura: No pneumothorax. No pleural effusion. Multilobar airspace opacities most of which are ground-glass in appearance are identified and more confluent in both lower lobes. Findings may represent stigmata of acute chest syndrome given history of sickle cell disease. Alternatively, multilobar pneumonia is a possibility as well. No effusion or pneumothorax. Upper abdomen: Unremarkable. Musculoskeletal: No aggressive appearing focal osseous lesions. 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 Review of the MIP images confirms the above findings. IMPRESSION: 1. Cardiomegaly with H-shaped thoracic vertebrae in keeping with stigmata from patient's history of sickle cell disease. 2. Diffuse multilobar but lower lobe predominant  ground-glass opacities suspicious for  acute chest syndrome. Alternatively, multilobar pneumonia is a possibility. 3. No acute pulmonary embolus. Electronically Signed   By: Ashley Royalty M.D.   On: 07/22/2017 18:43    Medications: I have reviewed the patient's current medications.  Assessment/Plan: A 27 year old female admitted with acute chest syndrome with hypoxemia.  #1 acute chest syndrome: Patient is improving.  She has been requiring oxygen prior to admission on home O2.  Continue IV Rocephin and Zithromax.  Continue treatment.  #2 sickle cell painful crisis: Patient on Dilaudid PCA.  Also Toradol and IV fluids.  Continue treatment  #3 sickle cell anemia: H&H stable.  Continue to monitor H&H.  #4 hypokalemia: Potassium has been repleted.  #5 hypoxemia: Continue oxygen.   LOS: 1 day   Naela Nodal,LAWAL 07/23/2017, 12:58 PM

## 2017-07-24 LAB — CBC
HCT: 19.3 % — ABNORMAL LOW (ref 36.0–46.0)
Hemoglobin: 6.7 g/dL — CL (ref 12.0–15.0)
MCH: 30.2 pg (ref 26.0–34.0)
MCHC: 34.7 g/dL (ref 30.0–36.0)
MCV: 86.9 fL (ref 78.0–100.0)
Platelets: 338 10*3/uL (ref 150–400)
RBC: 2.22 MIL/uL — ABNORMAL LOW (ref 3.87–5.11)
RDW: 26.1 % — ABNORMAL HIGH (ref 11.5–15.5)
WBC: 9.8 10*3/uL (ref 4.0–10.5)

## 2017-07-24 LAB — CBC WITH DIFFERENTIAL/PLATELET
BASOS ABS: 0 10*3/uL (ref 0.0–0.1)
BASOS PCT: 0 %
EOS ABS: 0.4 10*3/uL (ref 0.0–0.7)
Eosinophils Relative: 4 %
HCT: 20.3 % — ABNORMAL LOW (ref 36.0–46.0)
Hemoglobin: 7 g/dL — ABNORMAL LOW (ref 12.0–15.0)
Lymphocytes Relative: 33 %
Lymphs Abs: 3.1 10*3/uL (ref 0.7–4.0)
MCH: 30 pg (ref 26.0–34.0)
MCHC: 34.5 g/dL (ref 30.0–36.0)
MCV: 87.1 fL (ref 78.0–100.0)
MONO ABS: 0.9 10*3/uL (ref 0.1–1.0)
MONOS PCT: 10 %
Neutro Abs: 5 10*3/uL (ref 1.7–7.7)
Neutrophils Relative %: 53 %
Platelets: 346 10*3/uL (ref 150–400)
RBC: 2.33 MIL/uL — ABNORMAL LOW (ref 3.87–5.11)
RDW: 25.9 % — AB (ref 11.5–15.5)
WBC: 9.5 10*3/uL (ref 4.0–10.5)
nRBC: 31 /100 WBC — ABNORMAL HIGH

## 2017-07-24 LAB — COMPREHENSIVE METABOLIC PANEL
ALBUMIN: 3.6 g/dL (ref 3.5–5.0)
ALT: 65 U/L — ABNORMAL HIGH (ref 14–54)
ANION GAP: 8 (ref 5–15)
AST: 81 U/L — ABNORMAL HIGH (ref 15–41)
Alkaline Phosphatase: 44 U/L (ref 38–126)
BUN: <5 mg/dL — ABNORMAL LOW (ref 6–20)
CHLORIDE: 110 mmol/L (ref 101–111)
CO2: 22 mmol/L (ref 22–32)
Calcium: 8.3 mg/dL — ABNORMAL LOW (ref 8.9–10.3)
Creatinine, Ser: 0.41 mg/dL — ABNORMAL LOW (ref 0.44–1.00)
GFR calc non Af Amer: >60 mL/min (ref >60)
Glucose, Bld: 112 mg/dL — ABNORMAL HIGH (ref 65–99)
Potassium: 3.6 mmol/L (ref 3.5–5.1)
SODIUM: 140 mmol/L (ref 135–145)
Total Bilirubin: 1.4 mg/dL — ABNORMAL HIGH (ref 0.3–1.2)
Total Protein: 6 g/dL — ABNORMAL LOW (ref 6.5–8.1)

## 2017-07-24 LAB — PREPARE RBC (CROSSMATCH)

## 2017-07-24 MED ORDER — SODIUM CHLORIDE 0.9% IV SOLUTION
Freq: Once | INTRAVENOUS | Status: AC
  Administered 2017-07-24: 23:00:00 via INTRAVENOUS

## 2017-07-24 NOTE — Progress Notes (Signed)
Subjective: Patient is doing better today.  Oxygen level is much improved.  No fever or chills.  Pain is at 7 out of 10.  Patient is still on Dilaudid PCA with Toradol and IV fluids.  Also on IV antibiotics.  Cultures are negative still.  Objective: Vital signs in last 24 hours: Temp:  [98.2 F (36.8 C)-98.7 F (37.1 C)] 98.3 F (36.8 C) (06/21 2049) Pulse Rate:  [72-91] 86 (06/21 2049) Resp:  [10-18] 14 (06/21 2000) BP: (114-124)/(77-87) 120/86 (06/21 2049) SpO2:  [91 %-99 %] 96 % (06/21 2049) Weight change:  Last BM Date: 07/22/17  Intake/Output from previous day: 06/20 0701 - 06/21 0700 In: 4814.6 [P.O.:320; I.V.:4144.6; IV Piggyback:350] Out: -  Intake/Output this shift: No intake/output data recorded.  General appearance: alert, cooperative, appears stated age and no distress Neck: no adenopathy, no carotid bruit, no JVD, supple, symmetrical, trachea midline and thyroid not enlarged, symmetric, no tenderness/mass/nodules Resp: clear to auscultation bilaterally Cardio: regular rate and rhythm, S1, S2 normal, no murmur, click, rub or gallop GI: soft, non-tender; bowel sounds normal; no masses,  no organomegaly Extremities: extremities normal, atraumatic, no cyanosis or edema Pulses: 2+ and symmetric Skin: Skin color, texture, turgor normal. No rashes or lesions Neurologic: Grossly normal  Lab Results: Recent Labs    07/24/17 0420 07/24/17 2004  WBC 9.5 9.8  HGB 7.0* 6.7*  HCT 20.3* 19.3*  PLT 346 338   BMET Recent Labs    07/23/17 0733 07/24/17 0420  NA 141 140  K 3.4* 3.6  CL 109 110  CO2 25 22  GLUCOSE 122* 112*  BUN 5* <5*  CREATININE 0.50 0.41*  CALCIUM 8.2* 8.3*    Studies/Results: No results found.  Medications: I have reviewed the patient's current medications.  Assessment/Plan: A 27 year old female admitted with acute chest syndrome with hypoxemia.  #1 acute chest syndrome: Patient is improving.   Continue IV Rocephin and Zithromax.   Continue treatment.  #2 sickle cell painful crisis: Patient doing much better. Patient on Dilaudid PCA.  Also Toradol and IV fluids.  Continue treatment  #3 sickle cell anemia: H&H stable.  Continue to monitor H&H.  #4 hypokalemia: Potassium has been repleted.  #5 hypoxemia: Continue oxygen.   LOS: 2 days   GARBA,LAWAL 07/24/2017, 10:16 PM

## 2017-07-24 NOTE — Progress Notes (Signed)
CRITICAL VALUE ALERT  Critical Value:  Hemoglobin 6.7  Date & Time Notied:  07/24/2017 2041  Provider Notified: Alger Memos NP   Orders Received/Actions taken:

## 2017-07-25 DIAGNOSIS — D5700 Hb-SS disease with crisis, unspecified: Secondary | ICD-10-CM

## 2017-07-25 DIAGNOSIS — D5701 Hb-SS disease with acute chest syndrome: Principal | ICD-10-CM

## 2017-07-25 LAB — COMPREHENSIVE METABOLIC PANEL
ALT: 81 U/L — ABNORMAL HIGH (ref 14–54)
AST: 102 U/L — ABNORMAL HIGH (ref 15–41)
Albumin: 3.5 g/dL (ref 3.5–5.0)
Alkaline Phosphatase: 51 U/L (ref 38–126)
Anion gap: 5 (ref 5–15)
BUN: <5 mg/dL — ABNORMAL LOW (ref 6–20)
CO2: 25 mmol/L (ref 22–32)
Calcium: 8.2 mg/dL — ABNORMAL LOW (ref 8.9–10.3)
Chloride: 111 mmol/L (ref 101–111)
Creatinine, Ser: 0.4 mg/dL — ABNORMAL LOW (ref 0.44–1.00)
GFR calc Af Amer: >60 mL/min (ref >60)
GFR calc non Af Amer: >60 mL/min (ref >60)
Glucose, Bld: 106 mg/dL — ABNORMAL HIGH (ref 65–99)
Potassium: 4 mmol/L (ref 3.5–5.1)
Sodium: 141 mmol/L (ref 135–145)
Total Bilirubin: 1.3 mg/dL — ABNORMAL HIGH (ref 0.3–1.2)
Total Protein: 5.9 g/dL — ABNORMAL LOW (ref 6.5–8.1)

## 2017-07-25 LAB — CBC
HCT: 23.6 % — ABNORMAL LOW (ref 36.0–46.0)
Hemoglobin: 8 g/dL — ABNORMAL LOW (ref 12.0–15.0)
MCH: 29.9 pg (ref 26.0–34.0)
MCHC: 33.9 g/dL (ref 30.0–36.0)
MCV: 88.1 fL (ref 78.0–100.0)
Platelets: 352 10*3/uL (ref 150–400)
RBC: 2.68 MIL/uL — ABNORMAL LOW (ref 3.87–5.11)
RDW: 24.4 % — ABNORMAL HIGH (ref 11.5–15.5)
WBC: 9.3 10*3/uL (ref 4.0–10.5)

## 2017-07-25 LAB — GLUCOSE, CAPILLARY: Glucose-Capillary: 94 mg/dL (ref 65–99)

## 2017-07-25 MED ORDER — HYDROMORPHONE 1 MG/ML IV SOLN
INTRAVENOUS | Status: DC
  Administered 2017-07-25 (×2): 1 mg via INTRAVENOUS
  Administered 2017-07-25: 4 mg via INTRAVENOUS
  Administered 2017-07-25: 2.5 mg via INTRAVENOUS
  Administered 2017-07-25: 12:00:00 via INTRAVENOUS
  Administered 2017-07-25: 3.5 mg via INTRAVENOUS
  Administered 2017-07-25: 2.5 mg via INTRAVENOUS
  Administered 2017-07-26: 3 mg via INTRAVENOUS
  Administered 2017-07-26: 1 mg via INTRAVENOUS
  Administered 2017-07-26: 01:00:00 via INTRAVENOUS
  Administered 2017-07-26: 3.5 mg via INTRAVENOUS
  Administered 2017-07-26: 2 mg via INTRAVENOUS
  Filled 2017-07-25: qty 25

## 2017-07-25 MED ORDER — KETOROLAC TROMETHAMINE 15 MG/ML IJ SOLN
30.0000 mg | Freq: Four times a day (QID) | INTRAMUSCULAR | Status: DC
  Administered 2017-07-25 – 2017-07-27 (×8): 30 mg via INTRAVENOUS
  Filled 2017-07-25 (×7): qty 2

## 2017-07-25 NOTE — Plan of Care (Signed)
  Problem: Education: Goal: Knowledge of General Education information will improve Outcome: Progressing   Problem: Elimination: Goal: Will not experience complications related to urinary retention Outcome: Progressing   Problem: Pain Managment: Goal: General experience of comfort will improve Outcome: Progressing   Problem: Safety: Goal: Ability to remain free from injury will improve Outcome: Progressing   

## 2017-07-25 NOTE — Progress Notes (Signed)
Patient ID: Wendy [name], female   DOB: Jun 10, 1990, 27 y.o.   MRN: [id number] Subjective:  Patient is complaining of ongoing p[ain at 8/10 especially around her chest area, she has no fever, she has no cough, although still feels SOB especially on ambulation. No urinary symptom. Hb is stable S/P simple transfusion. Cultures are negative.   Objective:  Vital signs in last 24 hours:  Vitals:   07/25/17 0400 07/25/17 0522 07/25/17 0829 07/25/17 0947  BP:  112/75  137/87  Pulse:  74  94  Resp: 13 17 14 18   Temp:  98.9 F (37.2 C)  98.9 F (37.2 C)  TempSrc:  Oral    SpO2: 98% 93% 97% 99%  Weight:      Height:        Intake/Output from previous day:   Intake/Output Summary (Last 24 hours) at 07/25/2017 1154 Last data filed at 07/25/2017 0953 Gross per 24 hour  Intake 4437.5 ml  Output 850 ml  Net 3587.5 ml    Physical Exam: General: Alert, awake, oriented x3, in no acute distress.  HEENT: [place]/AT [place], EOMI Neck: Trachea midline,  no masses, no thyromegal,y no JVD, no carotid bruit OROPHARYNX:  Moist, No exudate/ erythema/lesions.  Heart: Regular rate and rhythm, without murmurs, rubs, gallops, PMI non-displaced, no heaves or thrills on palpation.  Lungs: Clear to auscultation, no wheezing or rhonchi noted. No increased vocal fremitus resonant to percussion  Abdomen: Soft, nontender, nondistended, positive bowel sounds, no masses no hepatosplenomegaly noted..  Neuro: No focal neurological deficits noted cranial nerves II through XII grossly intact. DTRs 2+ bilaterally upper and lower extremities. Strength 5 out of 5 in bilateral upper and lower extremities. Musculoskeletal: No warm swelling or erythema around joints, no spinal tenderness noted. Psychiatric: Patient alert and oriented x3, good insight and cognition, good recent to remote recall. Lymph node survey: No cervical axillary or inguinal lymphadenopathy noted.  Lab Results:  Basic Metabolic Panel:    Component Value  Date/Time   NA 141 07/25/2017 0410   K 4.0 07/25/2017 0410   CL 111 07/25/2017 0410   CO2 25 07/25/2017 0410   BUN <5 (L) 07/25/2017 0410   CREATININE 0.40 (L) 07/25/2017 0410   GLUCOSE 106 (H) 07/25/2017 0410   CALCIUM 8.2 (L) 07/25/2017 0410   CBC:    Component Value Date/Time   WBC 9.3 07/25/2017 0410   HGB 8.0 (L) 07/25/2017 0410   HCT 23.6 (L) 07/25/2017 0410   PLT 352 07/25/2017 0410   MCV 88.1 07/25/2017 0410   NEUTROABS 5.0 07/24/2017 0420   LYMPHSABS 3.1 07/24/2017 0420   MONOABS 0.9 07/24/2017 0420   EOSABS 0.4 07/24/2017 0420   BASOSABS 0.0 07/24/2017 0420    No results found for this or any previous visit (from the past 240 hour(s)).  Studies/Results: No results found.  Medications: Scheduled Meds: . acetaminophen  650 mg Oral Once  . enoxaparin (LOVENOX) injection  40 mg Subcutaneous Q24H  . HYDROmorphone   Intravenous Q4H  . hydroxyurea  500 mg Oral Daily  . ketorolac  30 mg Intravenous Q6H  . senna-docusate  1 tablet Oral BID   Continuous Infusions: . azithromycin Stopped (07/24/17 2212)  . cefTRIAXone (ROCEPHIN)  IV Stopped (07/24/17 2027)  . dextrose 5 % and 0.45% NaCl 175 mL/hr at 07/25/17 1134  . diphenhydrAMINE     PRN Meds:.diphenhydrAMINE **OR** diphenhydrAMINE, naloxone **AND** sodium chloride flush, ondansetron, polyethylene glycol  Assessment/Plan: Active Problems:   Acute chest syndrome ([place])  1.  Acute Chest Syndrome: Patient still in a lot of pain in her chest area, continue Ceftriaxone and Azithromycin. Continue all adjunct therapies including oxygen and incentive spirometry. 2. Hb Sickle Cell Disease with crisis: Continue IVF, continue weight based Dilaudid PCA, IV Toradol 30 mg Q 6 H, Monitor vitals very closely, Re-evaluate pain scale regularly, 2 L of Oxygen by [place]. 3. Sickle Cell Anemia: Hemoglobin is stable s/p simple transfusion of one unit PRBC yesterday, no indication for additional blood transfusion at this time. Continue to  monitor 4. Chronic pain Syndrome: Will transition to PO home pain medication including the long acting when patient is much improved in symptoms. 5. Hypoxemia: From ACS: Continue oxygen and supportive therapy.   Code Status: Full Code Family Communication: N/A Disposition Plan: Not yet ready for discharge  Deborah Lazcano  If 7PM-7AM, please contact night-coverage.  07/25/2017, 11:54 AM  LOS: 3 days

## 2017-07-25 NOTE — Progress Notes (Signed)
Patient  getting up multiple times every hour to urinate but on only small amount of urine is being rea leased ( 10-20 ml). Bladder scan performed post void residual showing 298 ml. Notified Dr. Doreene Burke via Shea Evans.

## 2017-07-26 LAB — BASIC METABOLIC PANEL
Anion gap: 7 (ref 5–15)
CHLORIDE: 112 mmol/L — AB (ref 101–111)
CO2: 26 mmol/L (ref 22–32)
Calcium: 8.9 mg/dL (ref 8.9–10.3)
Creatinine, Ser: 0.4 mg/dL — ABNORMAL LOW (ref 0.44–1.00)
GFR calc Af Amer: >60 mL/min (ref >60)
GFR calc non Af Amer: >60 mL/min (ref >60)
GLUCOSE: 90 mg/dL (ref 65–99)
POTASSIUM: 3.6 mmol/L (ref 3.5–5.1)
Sodium: 145 mmol/L (ref 135–145)

## 2017-07-26 LAB — CBC WITH DIFFERENTIAL/PLATELET
BASOS ABS: 0 10*3/uL (ref 0.0–0.1)
BASOS PCT: 0 %
Band Neutrophils: 0 %
Blasts: 0 %
EOS ABS: 0.4 10*3/uL (ref 0.0–0.7)
EOS PCT: 4 %
HCT: 25.1 % — ABNORMAL LOW (ref 36.0–46.0)
Hemoglobin: 8.5 g/dL — ABNORMAL LOW (ref 12.0–15.0)
LYMPHS ABS: 3 10*3/uL (ref 0.7–4.0)
Lymphocytes Relative: 31 %
MCH: 29.6 pg (ref 26.0–34.0)
MCHC: 33.9 g/dL (ref 30.0–36.0)
MCV: 87.5 fL (ref 78.0–100.0)
METAMYELOCYTES PCT: 0 %
MONO ABS: 0.9 10*3/uL (ref 0.1–1.0)
MONOS PCT: 9 %
MYELOCYTES: 0 %
Neutro Abs: 5.5 10*3/uL (ref 1.7–7.7)
Neutrophils Relative %: 56 %
Platelets: 358 10*3/uL (ref 150–400)
Promyelocytes Relative: 0 %
RBC: 2.87 MIL/uL — ABNORMAL LOW (ref 3.87–5.11)
RDW: 23.7 % — ABNORMAL HIGH (ref 11.5–15.5)
WBC: 9.8 10*3/uL (ref 4.0–10.5)
nRBC: 0 /100 WBC

## 2017-07-26 MED ORDER — OXYCODONE-ACETAMINOPHEN 5-325 MG PO TABS
1.0000 | ORAL_TABLET | ORAL | Status: DC | PRN
  Administered 2017-07-26 – 2017-07-27 (×3): 1 via ORAL
  Filled 2017-07-26 (×3): qty 1

## 2017-07-26 MED ORDER — HYDROMORPHONE 1 MG/ML IV SOLN
INTRAVENOUS | Status: DC
  Administered 2017-07-26: 3.5 mg via INTRAVENOUS
  Administered 2017-07-26 – 2017-07-27 (×3): 2.5 mg via INTRAVENOUS
  Administered 2017-07-27: 2 mg via INTRAVENOUS
  Administered 2017-07-27: 13:00:00 via INTRAVENOUS
  Administered 2017-07-27: 3 mg via INTRAVENOUS
  Administered 2017-07-27: 4 mg via INTRAVENOUS
  Filled 2017-07-26: qty 25

## 2017-07-26 MED ORDER — SALINE SPRAY 0.65 % NA SOLN
1.0000 | NASAL | Status: DC | PRN
Start: 2017-07-26 — End: 2017-07-27
  Administered 2017-07-26: 1 via NASAL
  Filled 2017-07-26: qty 44

## 2017-07-26 NOTE — Plan of Care (Signed)
  Problem: Education: Goal: Knowledge of General Education information will improve Outcome: Progressing   Problem: Activity: Goal: Risk for activity intolerance will decrease Outcome: Progressing   Problem: Elimination: Goal: Will not experience complications related to urinary retention Outcome: Progressing   Problem: Pain Managment: Goal: General experience of comfort will improve Outcome: Progressing   Problem: Safety: Goal: Ability to remain free from injury will improve Outcome: Progressing   Problem: Skin Integrity: Goal: Risk for impaired skin integrity will decrease Outcome: Progressing

## 2017-07-26 NOTE — Progress Notes (Signed)
Patient ID: Wendy [name], female   DOB: Jul 06, 1990, 27 y.o.   MRN: [id number] Subjective:  Patient appears much improved and comfortable, her only complaint is nasal dryness and irritation with the Oxygen by [place]. She has no fever, chest pain is improving, no SOB. She denies any headache, no blurry vision.   Objective:  Vital signs in last 24 hours:  Vitals:   07/26/17 0409 07/26/17 0653 07/26/17 0737 07/26/17 0921  BP: 130/89   135/83  Pulse: 64   78  Resp: 17 16 15 10   Temp: 98.6 F (37 C)   98.2 F (36.8 C)  TempSrc: Oral   Oral  SpO2: 96% 90% 92% 96%  Weight:      Height:        Intake/Output from previous day:   Intake/Output Summary (Last 24 hours) at 07/26/2017 1206 Last data filed at 07/26/2017 0817 Gross per 24 hour  Intake 1968.33 ml  Output 1375 ml  Net 593.33 ml    Physical Exam: General: Alert, awake, oriented x3, in no acute distress.  HEENT: [place]/AT [place], EOMI Neck: Trachea midline,  no masses, no thyromegal,y no JVD, no carotid bruit OROPHARYNX:  Moist, No exudate/ erythema/lesions.  Heart: Regular rate and rhythm, without murmurs, rubs, gallops, PMI non-displaced, no heaves or thrills on palpation.  Lungs: Clear to auscultation, no wheezing or rhonchi noted. No increased vocal fremitus resonant to percussion  Abdomen: Soft, nontender, nondistended, positive bowel sounds, no masses no hepatosplenomegaly noted..  Neuro: No focal neurological deficits noted cranial nerves II through XII grossly intact. DTRs 2+ bilaterally upper and lower extremities. Strength 5 out of 5 in bilateral upper and lower extremities. Musculoskeletal: No warm swelling or erythema around joints, no spinal tenderness noted. Psychiatric: Patient alert and oriented x3, good insight and cognition, good recent to remote recall. Lymph node survey: No cervical axillary or inguinal lymphadenopathy noted.  Lab Results:  Basic Metabolic Panel:    Component Value Date/Time   NA 145 07/26/2017  0830   K 3.6 07/26/2017 0830   CL 112 (H) 07/26/2017 0830   CO2 26 07/26/2017 0830   BUN <5 (L) 07/26/2017 0830   CREATININE 0.40 (L) 07/26/2017 0830   GLUCOSE 90 07/26/2017 0830   CALCIUM 8.9 07/26/2017 0830   CBC:    Component Value Date/Time   WBC 9.3 07/25/2017 0410   HGB 8.0 (L) 07/25/2017 0410   HCT 23.6 (L) 07/25/2017 0410   PLT 352 07/25/2017 0410   MCV 88.1 07/25/2017 0410   NEUTROABS 5.0 07/24/2017 0420   LYMPHSABS 3.1 07/24/2017 0420   MONOABS 0.9 07/24/2017 0420   EOSABS 0.4 07/24/2017 0420   BASOSABS 0.0 07/24/2017 0420    No results found for this or any previous visit (from the past 240 hour(s)).  Studies/Results: No results found.  Medications: Scheduled Meds: . acetaminophen  650 mg Oral Once  . enoxaparin (LOVENOX) injection  40 mg Subcutaneous Q24H  . HYDROmorphone   Intravenous Q4H  . hydroxyurea  500 mg Oral Daily  . ketorolac  30 mg Intravenous Q6H  . senna-docusate  1 tablet Oral BID   Continuous Infusions: . azithromycin Stopped (07/25/17 2301)  . cefTRIAXone (ROCEPHIN)  IV Stopped (07/25/17 2019)  . dextrose 5 % and 0.45% NaCl 10 mL/hr at 07/25/17 1800  . diphenhydrAMINE     PRN Meds:.diphenhydrAMINE **OR** diphenhydrAMINE, naloxone **AND** sodium chloride flush, ondansetron, polyethylene glycol  Assessment/Plan: Active Problems:   Acute chest syndrome ([place])  1. Acute Chest Syndrome: Pain is  much better today, no new symptom, continue Ceftriaxone and Azithromycin. Continue all adjunct therapies including oxygen and incentive spirometry. 2. Hb Sickle Cell Disease with crisis: Reduce IVF to [facility], continue weight based Dilaudid PCA, IV Toradol 30 mg Q 6 H, Monitor vitals very closely, Re-evaluate pain scale regularly, 2 L of Oxygen by [place]. 3. Sickle Cell Anemia: Hemoglobin is stable s/p simple transfusion of one unit PRBC yesterday, no indication for additional blood transfusion at this time. Continue to monitor 4. Chronic pain Syndrome: Start  PO home pain medication including the long acting  5. Hypoxemia: From ACS: Continue oxygen and supportive therapy. Nasal spray to assist with dryness  Code Status: Full Code Family Communication: N/A Disposition Plan: For possible discharge home tomorrow  Angelica Chessman  If 7PM-7AM, please contact night-coverage.  07/26/2017, 12:06 PM  LOS: 4 days

## 2017-07-27 ENCOUNTER — Inpatient Hospital Stay (HOSPITAL_COMMUNITY): Payer: Medicare Other

## 2017-07-27 DIAGNOSIS — R079 Chest pain, unspecified: Secondary | ICD-10-CM

## 2017-07-27 LAB — COMPREHENSIVE METABOLIC PANEL
ALBUMIN: 3.7 g/dL (ref 3.5–5.0)
ALT: 56 U/L — AB (ref 14–54)
ANION GAP: 8 (ref 5–15)
AST: 44 U/L — ABNORMAL HIGH (ref 15–41)
Alkaline Phosphatase: 60 U/L (ref 38–126)
BUN: 5 mg/dL — ABNORMAL LOW (ref 6–20)
CHLORIDE: 107 mmol/L (ref 101–111)
CO2: 26 mmol/L (ref 22–32)
Calcium: 8.9 mg/dL (ref 8.9–10.3)
Creatinine, Ser: 0.4 mg/dL — ABNORMAL LOW (ref 0.44–1.00)
GFR calc non Af Amer: >60 mL/min (ref >60)
Glucose, Bld: 92 mg/dL (ref 65–99)
Potassium: 3.5 mmol/L (ref 3.5–5.1)
SODIUM: 141 mmol/L (ref 135–145)
Total Bilirubin: 1.6 mg/dL — ABNORMAL HIGH (ref 0.3–1.2)
Total Protein: 6.3 g/dL — ABNORMAL LOW (ref 6.5–8.1)

## 2017-07-27 LAB — BPAM RBC
BLOOD PRODUCT EXPIRATION DATE: 201907012359
Blood Product Expiration Date: 201907062359
ISSUE DATE / TIME: 201906212259
ISSUE DATE / TIME: 201906200115
UNIT TYPE AND RH: 6200
Unit Type and Rh: 5100

## 2017-07-27 LAB — CBC WITH DIFFERENTIAL/PLATELET
BASOS PCT: 0 %
Basophils Absolute: 0 10*3/uL (ref 0.0–0.1)
EOS ABS: 0.6 10*3/uL (ref 0.0–0.7)
Eosinophils Relative: 6 %
HCT: 24.2 % — ABNORMAL LOW (ref 36.0–46.0)
HEMOGLOBIN: 8.4 g/dL — AB (ref 12.0–15.0)
Lymphocytes Relative: 27 %
Lymphs Abs: 2.5 10*3/uL (ref 0.7–4.0)
MCH: 30.4 pg (ref 26.0–34.0)
MCHC: 34.7 g/dL (ref 30.0–36.0)
MCV: 87.7 fL (ref 78.0–100.0)
MONOS PCT: 9 %
Monocytes Absolute: 0.8 10*3/uL (ref 0.1–1.0)
NEUTROS ABS: 5.6 10*3/uL (ref 1.7–7.7)
NEUTROS PCT: 58 %
PLATELETS: 347 10*3/uL (ref 150–400)
RBC: 2.76 MIL/uL — ABNORMAL LOW (ref 3.87–5.11)
RDW: 22.4 % — ABNORMAL HIGH (ref 11.5–15.5)
WBC: 9.5 10*3/uL (ref 4.0–10.5)

## 2017-07-27 LAB — TYPE AND SCREEN
ABO/RH(D): A POS
Antibody Screen: NEGATIVE
UNIT DIVISION: 0
Unit division: 0

## 2017-07-27 LAB — ECHOCARDIOGRAM COMPLETE
HEIGHTINCHES: 66 in
Weight: 2123.47 oz

## 2017-07-27 MED ORDER — AMOXICILLIN-POT CLAVULANATE 875-125 MG PO TABS: 1.0000 | ORAL_TABLET | Freq: Two times a day (BID) | ORAL | 0 refills | Status: AC

## 2017-07-27 MED ORDER — OXYCODONE-ACETAMINOPHEN 5-325 MG PO TABS: 1.0000 | ORAL_TABLET | ORAL | 0 refills | Status: AC | PRN

## 2017-07-27 NOTE — Progress Notes (Signed)
Patient states that her babysitter is going out of town and she has to go home to be with her son.  Dr. Doreene Burke was notified and patient has been discharged.

## 2017-07-27 NOTE — Care Management Important Message (Signed)
Important Message  Patient Details  Name: Wendy [name] MRN: [id number] Date of Birth: Jan 11, 1991   Medicare Important Message Given:  Yes    Kerin Salen 07/27/2017, 11:33 AMImportant Message  Patient Details  Name: Wendy [name] MRN: [id number] Date of Birth: 1990-07-05   Medicare Important Message Given:  Yes    Kerin Salen 07/27/2017, 11:33 AM

## 2017-07-27 NOTE — Progress Notes (Signed)
Patient ID: Wendy [name], female   DOB: September 24, 1990, 27 y.o.   MRN: [id number] Subjective:  Patient feels much better today. Noticed slow reaction time and sometimes not wanting to answer questions. She denies being depressed or suicidal ideation or thought. No fever, no SOB. Her chest pain has improved. She wants to walk around today to [name] how she feels.   Objective:  Vital signs in last 24 hours:  Vitals:   07/27/17 0408 07/27/17 0444 07/27/17 0900 07/27/17 0953  BP:  (!) 130/93  (!) 148/98  Pulse:  79  76  Resp: 13 17 12 12   Temp:  98.7 F (37.1 C)  98.9 F (37.2 C)  TempSrc:  Oral  Oral  SpO2: 97% 91% 97% 97%  Weight:      Height:        Intake/Output from previous day:   Intake/Output Summary (Last 24 hours) at 07/27/2017 1227 Last data filed at 07/27/2017 0900 Gross per 24 hour  Intake 590 ml  Output 850 ml  Net -260 ml    Physical Exam: General: Alert, awake, oriented x3, in no acute distress.  HEENT: [place]/AT [place], EOMI Neck: Trachea midline,  no masses, no thyromegal,y no JVD, no carotid bruit OROPHARYNX:  Moist, No exudate/ erythema/lesions.  Heart: Regular rate and rhythm, without murmurs, rubs, gallops, PMI non-displaced, no heaves or thrills on palpation.  Lungs: Clear to auscultation, no wheezing or rhonchi noted. No increased vocal fremitus resonant to percussion  Abdomen: Soft, nontender, nondistended, positive bowel sounds, no masses no hepatosplenomegaly noted..  Neuro: No focal neurological deficits noted cranial nerves II through XII grossly intact. DTRs 2+ bilaterally upper and lower extremities. Strength 5 out of 5 in bilateral upper and lower extremities. Musculoskeletal: No warm swelling or erythema around joints, no spinal tenderness noted. Psychiatric: Patient alert and oriented x3, Slow reaction time, no eye contact, smiled once during this encounter Lymph node survey: No cervical axillary or inguinal lymphadenopathy noted.  Lab Results:  Basic  Metabolic Panel:    Component Value Date/Time   NA 141 07/27/2017 0858   K 3.5 07/27/2017 0858   CL 107 07/27/2017 0858   CO2 26 07/27/2017 0858   BUN 5 (L) 07/27/2017 0858   CREATININE 0.40 (L) 07/27/2017 0858   GLUCOSE 92 07/27/2017 0858   CALCIUM 8.9 07/27/2017 0858   CBC:    Component Value Date/Time   WBC 9.5 07/27/2017 0858   HGB 8.4 (L) 07/27/2017 0858   HCT 24.2 (L) 07/27/2017 0858   PLT 347 07/27/2017 0858   MCV 87.7 07/27/2017 0858   NEUTROABS 5.6 07/27/2017 0858   LYMPHSABS 2.5 07/27/2017 0858   MONOABS 0.8 07/27/2017 0858   EOSABS 0.6 07/27/2017 0858   BASOSABS 0.0 07/27/2017 0858    No results found for this or any previous visit (from the past 240 hour(s)).  Studies/Results: Dg Chest 2 View  Result Date: 07/27/2017 CLINICAL DATA:  Sickle cell anemia, acute chest syndrome with shortness of breath. EXAM: CHEST - 2 VIEW COMPARISON:  Chest x-ray of May 19, 2017 and chest CT scan of January 21, 2018. FINDINGS: The lungs are adequately inflated. The interstitial markings are more prominent than in April but are similar to those seen previously. The lung markings are greatest in the lower lung zones bilaterally. The cardiac silhouette is mildly enlarged and the pulmonary vascularity engorged. The mediastinum is normal in width. There is a small left pleural effusion. IMPRESSION: Bilateral interstitial prominence suggests a combination of mild CHF with  interstitial pneumonia, or edema secondary to the acute chest syndrome. Electronically Signed   By: David  Martinique M.D.   On: 07/27/2017 11:04    Medications: Scheduled Meds: . enoxaparin (LOVENOX) injection  40 mg Subcutaneous Q24H  . HYDROmorphone   Intravenous Q4H  . hydroxyurea  500 mg Oral Daily  . senna-docusate  1 tablet Oral BID   Continuous Infusions: . cefTRIAXone (ROCEPHIN)  IV Stopped (07/26/17 2002)  . dextrose 5 % and 0.45% NaCl 10 mL/hr at 07/27/17 0544  . diphenhydrAMINE     PRN  Meds:.diphenhydrAMINE **OR** diphenhydrAMINE, naloxone **AND** sodium chloride flush, ondansetron, oxyCODONE-acetaminophen, polyethylene glycol, sodium chloride  Assessment/Plan: Active Problems:   Acute chest syndrome ([place])  1. Acute Chest Syndrome:Improving. Continue Ceftriaxone and Azithromycin. Continue all adjunct therapies including oxygen and incentive spirometry. 2. Hb Sickle Cell Disease with crisis: KVO, continue weight based Dilaudid PCA, IV Toradol 30 mg Q 6 H, Monitor vitals very closely, Re-evaluate pain scale regularly, 2 L of Oxygen by [place]. 3. Sickle Cell Anemia:Hemoglobin is stable s/p simple transfusion of one unit PRBC yesterday, no indication for additional blood transfusion at this time. Continue to monitor 4. Chronic pain Syndrome:PO home pain medication 5. Hypoxemia: From ACS: Continue oxygen and supportive therapy.Nasal spray to assist with dryness  Code Status: Full Code Family Communication: N/A Disposition Plan: Not yet ready for discharge  Wendy [name]  If 7PM-7AM, please contact night-coverage.  07/27/2017, 12:27 PM  LOS: 5 days

## 2017-07-27 NOTE — Progress Notes (Signed)
  Echocardiogram 2D Echocardiogram has been performed.  Patient will not get off phone long enough for exam to be accurately performed.   Wendy [name] [name] 07/27/2017, 3:07 PM

## 2017-07-27 NOTE — Discharge Instructions (Signed)
Sickle Cell Anemia, Adult °Sickle cell anemia is a condition in which red blood cells have an abnormal “sickle” shape. This abnormal shape shortens the cells’ life span, which results in a lower than normal concentration of red blood cells in the blood. The sickle shape also causes the cells to clump together and block free blood flow through the blood vessels. As a result, the tissues and organs of the body do not receive enough oxygen. Sickle cell anemia causes organ damage and pain and increases the risk of infection. °What are the causes? °Sickle cell anemia is a genetic disorder. Those who receive two copies of the gene have the condition, and those who receive one copy have the trait. °What increases the risk? °The sickle cell gene is most common in people whose families originated in [place]. Other areas of the globe where sickle cell trait occurs include the Mediterranean, [place] and [place], the [place], and the [place]. °What are the signs or symptoms? °· Pain, especially in the extremities, back, chest, or abdomen (common). The pain may start suddenly or may develop following an illness, especially if there is dehydration. Pain can also occur due to overexertion or exposure to extreme temperature changes. °· Frequent severe bacterial infections, especially certain types of pneumonia and meningitis. °· Pain and swelling in the hands and feet. °· Decreased activity. °· Loss of appetite. °· Change in behavior. °· Headaches. °· Seizures. °· Shortness of breath or difficulty breathing. °· Vision changes. °· Skin ulcers. °Those with the trait may not have symptoms or they may have mild symptoms. °How is this diagnosed? °Sickle cell anemia is diagnosed with blood tests that demonstrate the genetic trait. It is often diagnosed during the newborn period, due to mandatory testing nationwide. A variety of blood tests, X-rays, CT scans, MRI scans, ultrasounds, and lung function tests may also be done to  monitor the condition. °How is this treated? °Sickle cell anemia may be treated with: °· Medicines. You may be given pain medicines, antibiotic medicines (to treat and prevent infections) or medicines to increase the production of certain types of hemoglobin. °· Fluids. °· Oxygen. °· Blood transfusions. ° °Follow these instructions at home: °· Drink enough fluid to keep your urine clear or pale yellow. Increase your fluid intake in hot weather and during exercise. °· Do not smoke. Smoking lowers oxygen levels in the blood. °· Only take over-the-counter or prescription medicines for pain, fever, or discomfort as directed by your health care provider. °· Take antibiotics as directed by your health care provider. Make sure you finish them it even if you start to feel better. °· Take supplements as directed by your health care provider. °· Consider wearing a medical alert bracelet. This tells anyone caring for you in an emergency of your condition. °· When traveling, keep your medical information, health care provider's names, and the medicines you take with you at all times. °· If you develop a fever, do not take medicines to reduce the fever right away. This could cover up a problem that is developing. Notify your health care provider. °· Keep all follow-up appointments with your health care provider. Sickle cell anemia requires regular medical care. °Contact a health care provider if: °You have a fever. °Get help right away if: °· You feel dizzy or faint. °· You have new abdominal pain, especially on the left side near the stomach area. °· You develop a persistent, often uncomfortable and painful penile erection (priapism). If this is not   treated immediately it will lead to impotence. °· You have numbness your arms or legs or you have a hard time moving them. °· You have a hard time with speech. °· You have a fever or persistent symptoms for more than 2-3 days. °· You have a fever and your symptoms suddenly get  worse. °· You have signs or symptoms of infection. These include: °? Chills. °? Abnormal tiredness (lethargy). °? Irritability. °? Poor eating. °? Vomiting. °· You develop pain that is not helped with medicine. °· You develop shortness of breath. °· You have pain in your chest. °· You are coughing up pus-like or bloody sputum. °· You develop a stiff neck. °· Your feet or hands swell or have pain. °· Your abdomen appears bloated. °· You develop joint pain. °This information is not intended to replace advice given to you by your health care provider. Make sure you discuss any questions you have with your health care provider. °Document Released: 04/30/2005 Document Revised: 08/10/2015 Document Reviewed: 09/01/2012 °Elsevier Interactive Patient Education © 2017 [employer] ° °

## 2017-07-27 NOTE — Discharge Summary (Signed)
Physician Discharge Summary  Wendy [name] UTM:[id number] DOB: [birth year] DOA: 07/22/2017  PCP: Wendy, No Pcp Per  Admit date: 07/22/2017  Discharge date: 07/27/2017  Discharge Diagnoses:  Active Problems:   Acute chest syndrome Wendy [name])  Discharge Condition: Stable  Disposition:  [place]. Schedule an appointment as soon as possible for a visit in 1 week(s).   Specialty:  Internal Medicine Contact information: [place] [phone]         Pt is discharged home in good condition and is to follow up with [place] at [name] this week to have labs evaluated. Wendy [name] is instructed to increase activity slowly and balance with rest for the next few days, and use prescribed medication to complete treatment of pain  Diet: Regular Wt Readings from Last 3 Encounters:  07/23/17 60.2 kg (132 lb 11.5 oz)  07/19/17 56.7 kg (125 lb)  06/26/17 54.4 kg (120 lb)    History of present illness: Wendy [name]  is a 27 y.o. female, w sickle cell anemia, who was recently admitted and left AMA who now presents with continued generalized pain as well as pain over her right and left chest .  Slight dyspnea.  Pt denies fever, chills, cough, palp, n/v, diarrhea, brbpr.    In Ed,  CTA chest  IMPRESSION: 1. Cardiomegaly with H-shaped thoracic vertebrae in keeping with stigmata from Wendy's history of sickle cell disease. 2. Diffuse multilobar but lower lobe predominant ground-glass opacities suspicious for acute chest syndrome. Alternatively, multilobar pneumonia is a possibility. 3. No acute pulmonary embolus.  Wbc 14.8, Hgb 7.0, Plt 415 retic 22.9 Na 143, K 3.0, Bun 6, Creatinine 0.51  Pt tx with dilaudid x multiple doses without relief along with o2, and ns iv, and given potassium chloride 40 meq po x1  Pt will be admitted for sickle cell crisis as well as acute chest  syndrome  [name] Course:  Wendy was admitted for Acute Chest Syndrome Sickle Cell Pain Crisis and managed appropriately on telemetry unit with IVF, IV Dilaudid via PCA and IV Toradol, as well as other adjunct therapies per sickle cell pain management protocols. She was also placed on IV Azithromycin and Ceftriaxone for ACS.  Her Hb dropped to a nadir of 6.7 from  8.5 within 2 days of admission, for which she received one unit of PRBC. Cultures were negative. She slowly got better. She remained hemodynamically stable on admission. She completed the 5 days course of Azithromycin IV, however on 07/27/2017, 5th day on admission, Wendy called and wanted to be discharged or she would sign AMA. This she said was because the person baby sitting her son is about to leave and her son would be left without any adult to care for him. If Wendy was allowed to leave AMA, she would have no prescription, so I decided to assist Wendy with proper discharge from the [name] and with clear instruction on return precautions. She was discharged home in a hemodynamically stable condition with prescription for 60 tablets of Percocet for about 15 days until she is able to secure appointment to visit the [name] to establish care. She was also prescribed 5 days of Augmentin to complete the 10-day antibiotic course. Wendy claims her pain is at baseline at the time of discharge and that there would not be any reason to return to ED once she is able to fill her prescriptions.  Discharge Exam: Vitals:   07/27/17 1249 07/27/17 1403  BP:  (!) 155/101  Pulse:  91  Resp: 17   Temp:  98.4 F (36.9 C)  SpO2: 98%    Vitals:   07/27/17 0900 07/27/17 0953 07/27/17 1249 07/27/17 1403  BP:  (!) 148/98  (!) 155/101  Pulse:  76  91  Resp: 12 12 17    Temp:  98.9 F (37.2 C)  98.4 F (36.9 C)  TempSrc:  Oral    SpO2: 97% 97% 98%   Weight:      Height:        General appearance : Awake, alert, not in any  distress. Speech Clear. Not toxic looking HEENT: Atraumatic and Normocephalic, pupils equally reactive to light and accomodation Neck: Supple, no JVD. No cervical lymphadenopathy.  Chest: Good air entry bilaterally, no added sounds  CVS: S1 S2 regular, no murmurs.  Abdomen: Bowel sounds present, Non tender and not distended with no gaurding, rigidity or rebound. Extremities: B/L Lower Ext shows no edema, both legs are warm to touch Neurology: Awake alert, and oriented X 3, CN II-XII intact, Non focal Skin: No Rash  Discharge Instructions  Discharge Instructions    Diet - low sodium heart healthy   Complete by:  As directed    Increase activity slowly   Complete by:  As directed      Allergies as of 07/27/2017   No Known Allergies     Medication List    TAKE these medications   acetaminophen 325 MG tablet Commonly known as:  TYLENOL Take 2 tablets (650 mg total) by mouth every 6 (six) hours.   amoxicillin-clavulanate 875-125 MG tablet Commonly known as:  AUGMENTIN Take 1 tablet by mouth 2 (two) times daily for 10 days.   oxyCODONE-acetaminophen 5-325 MG tablet Commonly known as:  PERCOCET/ROXICET Take 1 tablet by mouth every 4 (four) hours as needed for up to 15 days for severe pain.       The results of significant diagnostics from this hospitalization (including imaging, microbiology, ancillary and laboratory) are listed below for reference.    Significant Diagnostic Studies: Dg Chest 2 View  Result Date: 07/27/2017 CLINICAL DATA:  Sickle cell anemia, acute chest syndrome with shortness of breath. EXAM: CHEST - 2 VIEW COMPARISON:  Chest x-ray of May 19, 2017 and chest CT scan of January 21, 2018. FINDINGS: The lungs are adequately inflated. The interstitial markings are more prominent than in April but are similar to those seen previously. The lung markings are greatest in the lower lung zones bilaterally. The cardiac silhouette is mildly enlarged and the pulmonary  vascularity engorged. The mediastinum is normal in width. There is a small left pleural effusion. IMPRESSION: Bilateral interstitial prominence suggests a combination of mild CHF with interstitial pneumonia, or edema secondary to the acute chest syndrome. Electronically Signed   By: David  Martinique M.D.   On: 07/27/2017 11:04   Ct Angio Chest Pe W And/or Wo Contrast  Result Date: 07/22/2017 CLINICAL DATA:  Dyspnea and body pain x2 days. History of sickle cell anemia and sickle cell crisis. EXAM: CT ANGIOGRAPHY CHEST WITH CONTRAST TECHNIQUE: Multidetector CT imaging of the chest was performed using the standard protocol during bolus administration of intravenous contrast. Multiplanar CT image reconstructions and MIPs were obtained to evaluate the vascular anatomy. CONTRAST:  181mL ISOVUE-370 IOPAMIDOL (ISOVUE-370) INJECTION 76% COMPARISON:  05/19/2017 FINDINGS: Cardiovascular: The study is of quality for the evaluation of pulmonary embolism. There are no filling defects  in the central, lobar, segmental or subsegmental pulmonary artery branches to suggest acute pulmonary embolism. Cardiomegaly is redemonstrated without evidence of pericardial effusion. Motion related artifacts limit assessment of the aortic root and ascending aorta. No aneurysmal dilatation is identified. No definite evidence of dissection given motion artifacts. Mediastinum/Nodes: No discrete thyroid nodules. Unremarkable esophagus. No pathologically enlarged axillary, mediastinal or hilar lymph nodes. Lungs/Pleura: No pneumothorax. No pleural effusion. Multilobar airspace opacities most of which are ground-glass in appearance are identified and more confluent in both lower lobes. Findings may represent stigmata of acute chest syndrome given history of sickle cell disease. Alternatively, multilobar pneumonia is a possibility as well. No effusion or pneumothorax. Upper abdomen: Unremarkable. Musculoskeletal: No aggressive appearing focal osseous  lesions. 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 Review of the MIP images confirms the above findings. IMPRESSION: 1. Cardiomegaly with H-shaped thoracic vertebrae in keeping with stigmata from Wendy's history of sickle cell disease. 2. Diffuse multilobar but lower lobe predominant ground-glass opacities suspicious for acute chest syndrome. Alternatively, multilobar pneumonia is a possibility. 3. No acute pulmonary embolus. Electronically Signed   By: Ashley Royalty M.D.   On: 07/22/2017 18:43    Microbiology: No results found for this or any previous visit (from the past 240 hour(s)).   Labs: Basic Metabolic Panel: Recent Labs  Lab 07/23/17 0733 07/24/17 0420 07/25/17 0410 07/26/17 0830 07/27/17 0858  NA 141 140 141 145 141  K 3.4* 3.6 4.0 3.6 3.5  CL 109 110 111 112* 107  CO2 25 22 25 26 26   GLUCOSE 122* 112* 106* 90 92  BUN 5* <5* <5* <5* 5*  CREATININE 0.50 0.41* 0.40* 0.40* 0.40*  CALCIUM 8.2* 8.3* 8.2* 8.9 8.9   Liver Function Tests: Recent Labs  Lab 07/23/17 0733 07/24/17 0420 07/25/17 0410 07/27/17 0858  AST 64* 81* 102* 44*  ALT 53 65* 81* 56*  ALKPHOS 39 44 51 60  BILITOT 1.7* 1.4* 1.3* 1.6*  PROT 6.5 6.0* 5.9* 6.3*  ALBUMIN 3.9 3.6 3.5 3.7   No results for input(s): LIPASE, AMYLASE in the last 168 hours. No results for input(s): AMMONIA in the last 168 hours. CBC: Recent Labs  Lab 07/22/17 1606  07/24/17 0420 07/24/17 2004 07/25/17 0410 07/26/17 0830 07/27/17 0858  WBC 14.8*   < > 9.5 9.8 9.3 9.8 9.5  NEUTROABS 10.0*  --  5.0  --   --  5.5 5.6  HGB 7.0*   < > 7.0* 6.7* 8.0* 8.5* 8.4*  HCT 20.9*   < > 20.3* 19.3* 23.6* 25.1* 24.2*  MCV 86.7   < > 87.1 86.9 88.1 87.5 87.7  PLT 415*   < > 346 338 352 358 347   < > = values in this interval not displayed.   Cardiac Enzymes: No results for input(s):  CKTOTAL, CKMB, CKMBINDEX, TROPONINI in the last 168 hours. BNP: Invalid input(s): POCBNP CBG: Recent Labs  Lab 07/25/17 1331  GLUCAP 94    Time coordinating discharge: 50 minutes  Signed:  Emaline Karnes  [facility] 07/27/2017, 5:55 PM

## 2017-07-27 NOTE — Progress Notes (Signed)
Pt BP 155/101.  Dr. Doreene Burke aware.  Will continue to monitor patient.

## 2017-08-04 ENCOUNTER — Telehealth: Payer: Self-pay | Admitting: Family Medicine

## 2017-08-04 ENCOUNTER — Emergency Department (HOSPITAL_COMMUNITY): Payer: Medicare Other

## 2017-08-04 ENCOUNTER — Encounter (HOSPITAL_COMMUNITY): Payer: Self-pay

## 2017-08-04 ENCOUNTER — Telehealth: Payer: Self-pay | Admitting: *Deleted

## 2017-08-04 ENCOUNTER — Other Ambulatory Visit: Payer: Self-pay

## 2017-08-04 ENCOUNTER — Emergency Department (HOSPITAL_COMMUNITY)
Admission: EM | Admit: 2017-08-04 | Discharge: 2017-08-04 | Disposition: A | Discharge status: Home / Self Care | Payer: Medicare Other | Attending: Emergency Medicine | Admitting: Emergency Medicine

## 2017-08-04 DIAGNOSIS — D57219 Sickle-cell/Hb-C disease with crisis, unspecified: Secondary | ICD-10-CM | POA: Diagnosis present

## 2017-08-04 DIAGNOSIS — Z79899 Other long term (current) drug therapy: Secondary | ICD-10-CM | POA: Insufficient documentation

## 2017-08-04 DIAGNOSIS — D5700 Hb-SS disease with crisis, unspecified: Secondary | ICD-10-CM

## 2017-08-04 LAB — RETICULOCYTES
RBC.: 3.93 MIL/uL (ref 3.87–5.11)
RETIC COUNT ABSOLUTE: 149.3 10*3/uL (ref 19.0–186.0)
Retic Ct Pct: 3.8 % — ABNORMAL HIGH (ref 0.4–3.1)

## 2017-08-04 LAB — COMPREHENSIVE METABOLIC PANEL
ALT: 24 U/L (ref 0–44)
AST: 40 U/L (ref 15–41)
Albumin: 5.4 g/dL — ABNORMAL HIGH (ref 3.5–5.0)
Alkaline Phosphatase: 45 U/L (ref 38–126)
Anion gap: 11 (ref 5–15)
BILIRUBIN TOTAL: 1.7 mg/dL — AB (ref 0.3–1.2)
BUN: 8 mg/dL (ref 6–20)
CALCIUM: 9.9 mg/dL (ref 8.9–10.3)
CHLORIDE: 103 mmol/L (ref 98–111)
CO2: 23 mmol/L (ref 22–32)
CREATININE: 0.55 mg/dL (ref 0.44–1.00)
Glucose, Bld: 100 mg/dL — ABNORMAL HIGH (ref 70–99)
Potassium: 4.3 mmol/L (ref 3.5–5.1)
Sodium: 137 mmol/L (ref 135–145)
TOTAL PROTEIN: 8.5 g/dL — AB (ref 6.5–8.1)

## 2017-08-04 LAB — I-STAT BETA HCG BLOOD, ED (MC, WL, AP ONLY): I-stat hCG, quantitative: <5 m[IU]/mL (ref <5)

## 2017-08-04 LAB — CBC WITH DIFFERENTIAL/PLATELET
BASOS ABS: 0.1 10*3/uL (ref 0.0–0.1)
Basophils Relative: 1 %
EOS ABS: 0.2 10*3/uL (ref 0.0–0.7)
EOS PCT: 2 %
HCT: 35.4 % — ABNORMAL LOW (ref 36.0–46.0)
Hemoglobin: 11.5 g/dL — ABNORMAL LOW (ref 12.0–15.0)
LYMPHS ABS: 3.6 10*3/uL (ref 0.7–4.0)
Lymphocytes Relative: 35 %
MCH: 29.3 pg (ref 26.0–34.0)
MCHC: 32.5 g/dL (ref 30.0–36.0)
MCV: 90.1 fL (ref 78.0–100.0)
MONO ABS: 0.8 10*3/uL (ref 0.1–1.0)
Monocytes Relative: 8 %
NEUTROS PCT: 54 %
Neutro Abs: 5.6 10*3/uL (ref 1.7–7.7)
PLATELETS: 620 10*3/uL — AB (ref 150–400)
RBC: 3.93 MIL/uL (ref 3.87–5.11)
RDW: 18 % — AB (ref 11.5–15.5)
WBC: 10.3 10*3/uL (ref 4.0–10.5)

## 2017-08-04 MED ORDER — DIPHENHYDRAMINE HCL 50 MG/ML IJ SOLN
25.0000 mg | Freq: Once | INTRAMUSCULAR | Status: AC
  Administered 2017-08-04: 25 mg via INTRAVENOUS
  Filled 2017-08-04: qty 1

## 2017-08-04 MED ORDER — DIPHENHYDRAMINE HCL 25 MG PO CAPS
25.0000 mg | ORAL_CAPSULE | ORAL | Status: DC | PRN
  Administered 2017-08-04: 25 mg via ORAL
  Filled 2017-08-04 (×2): qty 1

## 2017-08-04 MED ORDER — HYDROMORPHONE HCL 1 MG/ML IJ SOLN: 1.0000 mg | INTRAMUSCULAR | Status: AC

## 2017-08-04 MED ORDER — HYDROMORPHONE HCL 1 MG/ML IJ SOLN
1.0000 mg | INTRAMUSCULAR | Status: AC
  Administered 2017-08-04: 1 mg via INTRAVENOUS
  Filled 2017-08-04: qty 1

## 2017-08-04 MED ORDER — OXYCODONE-ACETAMINOPHEN 5-325 MG PO TABS: 1.0000 | ORAL_TABLET | Freq: Four times a day (QID) | ORAL | 0 refills | Status: DC | PRN

## 2017-08-04 MED ORDER — KETOROLAC TROMETHAMINE 30 MG/ML IJ SOLN
30.0000 mg | INTRAMUSCULAR | Status: AC
  Administered 2017-08-04: 30 mg via INTRAVENOUS
  Filled 2017-08-04: qty 1

## 2017-08-04 MED ORDER — SODIUM CHLORIDE 0.45 % IV SOLN
INTRAVENOUS | Status: DC
  Administered 2017-08-04: 15:00:00 via INTRAVENOUS

## 2017-08-04 MED ORDER — HYDROMORPHONE HCL 1 MG/ML IJ SOLN
1.0000 mg | INTRAMUSCULAR | Status: AC
  Administered 2017-08-04 (×2): 1 mg via INTRAVENOUS
  Filled 2017-08-04 (×2): qty 1

## 2017-08-04 MED ORDER — HYDROMORPHONE HCL 1 MG/ML IJ SOLN
0.5000 mg | INTRAMUSCULAR | Status: AC
  Administered 2017-08-04: 0.5 mg via INTRAVENOUS
  Filled 2017-08-04: qty 1

## 2017-08-04 MED ORDER — ONDANSETRON HCL 4 MG/2ML IJ SOLN: 4.0000 mg | INTRAMUSCULAR | Status: DC | PRN

## 2017-08-04 MED ORDER — HYDROMORPHONE HCL 1 MG/ML IJ SOLN: 0.5000 mg | INTRAMUSCULAR | Status: AC

## 2017-08-04 MED ORDER — FAMOTIDINE IN NACL 20-0.9 MG/50ML-% IV SOLN
20.0000 mg | Freq: Once | INTRAVENOUS | Status: AC
  Administered 2017-08-04: 20 mg via INTRAVENOUS
  Filled 2017-08-04: qty 50

## 2017-08-04 NOTE — ED Triage Notes (Signed)
Pt c/o aching since yesterday denies any specific area.

## 2017-08-04 NOTE — ED Notes (Signed)
Pt given [place] sandwich per PA

## 2017-08-04 NOTE — ED Notes (Signed)
Pt back from X-ray.  

## 2017-08-04 NOTE — Telephone Encounter (Signed)
Tried to notify patient that they now have a sickle cell center appt, asked the person who picked up to have her call us

## 2017-08-04 NOTE — Telephone Encounter (Signed)
Pt called today and wanted to see if her appt for today could be sooner.  Advised that her appt is not today it is for the 16th.  Advised that she is actually supposed to be a pt @ [facility], not [place].  Pt is upset because she knows her appt was today and "s[facility]le cell clinic is not taking new patients"  Advised I would send a message to MD, but she needed to be seen @ [facility] cell clinic.  She abruptly hung up on me. Haynes Giannotti, Salome Spotted, CMA

## 2017-08-04 NOTE — ED Notes (Addendum)
Pt ambulatory to RR w steady gait

## 2017-08-04 NOTE — ED Notes (Signed)
Patient transported to X-ray 

## 2017-08-04 NOTE — Discharge Instructions (Addendum)
Follow up with sickle cell clinic. 

## 2017-08-04 NOTE — Telephone Encounter (Signed)
Patient called trying to move up a prior appt she had with [place] and became angry on the phone with CMA when told that our expectation was that she would be establishing with the  sickle cell clinic.  She said they are not taking new patients.  I called them and she now has an appt on 7/29 at 9:20am to establish care.    I tried to call patient but she did not pick up the phone so I asked the person who picked up to have her call us.  This patient will be best cared for by a clinic specializing in sickle cell disease and we have made arrangements for her to be established with the most appropriate clinic in town.  I am not her pcp.  -Dr. Criss Rosales

## 2017-08-04 NOTE — ED Provider Notes (Addendum)
[facility] EMERGENCY DEPARTMENT Provider Note   CSN: [id number] Arrival date & time: 08/04/17  1139     History   Chief Complaint Chief Complaint  Patient presents with  . Sickle Cell Pain Crisis    HPI Wendy [name] is a 27 y.o. female.  HPI  Wendy [name] is a 27 year old female with a history of sickle cell anemia complicated by acute chest syndrome for which she was admitted 07/22/2017 and tobacco abuse who presents to the emergency department for evaluation of pain crisis.  Patient reports that she recently moved from [facility] and does not have a sickle cell doctor in the area.  She does not take opioids daily.  States that she ran out of Percocet last week which she was discharged with after her last admission.  Her current pain crisis started yesterday.  She reports pain is typical of her normal crisis and pain is located in bilateral hips, thighs and knees.  She reports pain feels aching and is constant, no specific trigger.  She denies recent injury or fall.  She thinks of being inside and the cold air conditioning may be the cause of her current pain crisis.  She denies fevers, chills, cough, chest pain, wheezing, abdominal pain, nausea/vomiting, numbness, weakness, headache.  She was discharged from the hospital 07/28/2017 for pain crisis with acute chest syndrome and reports that she is completed her course of antibiotics.  She reports that she has minimal shortness of breath, but that it is greatly improved since her last hospitalization. Denies leg swelling or calf tenderness.  She states that she has tried calling the sickle cell clinic in [place], but reports that they are not accepting any new patients.  Past Medical History:  Diagnosis Date  . Acute chest syndrome ([place]) 07/22/2017  . Anemia   . Sickle cell anemia (HCC)   . Tobacco abuse     Patient Active Problem List   Diagnosis Date Noted  . Acute chest syndrome ([place]) 07/22/2017  . Sickle cell  crisis ([place]) 05/12/2017  . Sickle cell pain crisis ([place]) 05/27/2016  . Leukocytosis 05/27/2016  . Sickle cell anemia (HCC)     Past Surgical History:  Procedure Laterality Date  . CESAREAN SECTION    . CESAREAN SECTION WITH BILATERAL TUBAL LIGATION       OB History   None      Home Medications    Prior to Admission medications   Medication Sig Start Date End Date Taking? Authorizing Provider  oxyCODONE-acetaminophen (PERCOCET/ROXICET) 5-325 MG tablet Take 1 tablet by mouth every 4 (four) hours as needed for up to 15 days for severe pain. 07/27/17 08/11/17 Yes Wendy Garter, MD  acetaminophen (TYLENOL) 325 MG tablet Take 2 tablets (650 mg total) by mouth every 6 (six) hours. Patient not taking: Reported on 07/22/2017 05/02/17   Wendy Mould, MD  amoxicillin-clavulanate (AUGMENTIN) 875-125 MG tablet Take 1 tablet by mouth 2 (two) times daily for 10 days. Patient not taking: Reported on 08/04/2017 07/27/17 08/06/17  Wendy Garter, MD    Family History Family History  Problem Relation Age of Onset  . Sickle cell trait Father     Social History Social History   Tobacco Use  . Smoking status: Never Smoker  . Smokeless tobacco: Never Used  Substance Use Topics  . Alcohol use: No  . Drug use: No     Allergies   Patient has no known allergies.   Review of Systems Review of  Systems  Constitutional: Negative for chills and fever.  HENT: Negative for congestion and sore throat.   Respiratory: Positive for shortness of breath (improving from past admission).   Cardiovascular: Negative for chest pain.  Gastrointestinal: Negative for abdominal pain, nausea and vomiting.  Genitourinary: Negative for difficulty urinating.  Musculoskeletal: Positive for arthralgias (bilateral hips and knees). Negative for back pain, gait problem and neck pain.  Skin: Negative for rash.  Neurological: Negative for syncope, weakness, light-headedness and numbness.    Psychiatric/Behavioral: Negative for agitation.     Physical Exam Updated Vital Signs BP 110/71   Pulse 79   Temp 99 F (37.2 C) (Oral)   Resp 12   Ht 5\' 6"  (1.676 m)   Wt 58.8 kg (129 lb 10.1 oz)   LMP 07/21/2017   SpO2 97%   BMI 20.92 kg/m   Physical Exam  Constitutional: She is oriented to person, place, and time. She appears well-developed and well-nourished. No distress.  HENT:  Head: Normocephalic and atraumatic.  Mouth/Throat: Oropharynx is clear and moist. No oropharyngeal exudate.  Eyes: Pupils are equal, round, and reactive to light. Right eye exhibits no discharge. Left eye exhibits no discharge. No scleral icterus.  Neck: Normal range of motion. Neck supple.  Cardiovascular: Normal rate, regular rhythm and intact distal pulses.  Pulmonary/Chest: Effort normal and breath sounds normal. No stridor. No respiratory distress. She has no wheezes. She has no rales.  Abdominal: Soft. Bowel sounds are normal. There is no tenderness. There is no guarding.  Musculoskeletal: Normal range of motion.  No break in skin, rash or ecchymosis noted over bilateral hips or legs. Legs non-tender to palpation. Full ROM. DP pulses 2+ bilaterally.   Neurological: She is alert and oriented to person, place, and time. Coordination normal.  Skin: Skin is warm and dry. She is not diaphoretic.  Psychiatric: She has a normal mood and affect. Her behavior is normal.  Nursing note and vitals reviewed.    ED Treatments / Results  Labs (all labs ordered are listed, but only abnormal results are displayed) Labs Reviewed  RETICULOCYTES - Abnormal; Notable for the following components:      Result Value   Retic Ct Pct 3.8 (*)    All other components within normal limits  COMPREHENSIVE METABOLIC PANEL - Abnormal; Notable for the following components:   Glucose, Bld 100 (*)    Total Protein 8.5 (*)    Albumin 5.4 (*)    Total Bilirubin 1.7 (*)    All other components within normal limits   CBC WITH DIFFERENTIAL/PLATELET - Abnormal; Notable for the following components:   Hemoglobin 11.5 (*)    HCT 35.4 (*)    RDW 18.0 (*)    Platelets 620 (*)    All other components within normal limits  I-STAT BETA HCG BLOOD, ED ([facility], [facility], [facility] ONLY)    EKG None  Radiology Dg Chest 2 View  Result Date: 08/04/2017 CLINICAL DATA:  Generalized body aches.  Shortness of breath. EXAM: CHEST - 2 VIEW COMPARISON:  07/27/2017. FINDINGS: Mediastinum hilar structures normal. Interim near complete clearing of previously identified basilar interstitial prominence. No pleural effusion or pneumothorax. Thoracic spine scoliosis and degenerative change. IMPRESSION: Interim near complete clearing of previously identified basilar interstitial prominence. No acute new infiltrates are noted. Electronically Signed   By: Marcello Moores  Register   On: 08/04/2017 15:22    Procedures Procedures (including critical care time)  CRITICAL CARE Performed by: Glyn Ade   Total critical care  time: 35 minutes  Critical care time was exclusive of separately billable procedures and treating other patients.  Critical care was necessary to treat or prevent imminent or life-threatening deterioration.  Critical care was time spent personally by me on the following activities: development of treatment plan with patient and/or surrogate as well as nursing, discussions with consultants, evaluation of patient's response to treatment, examination of patient, obtaining history from patient or surrogate, ordering and performing treatments and interventions, ordering and review of laboratory studies, ordering and review of radiographic studies, pulse oximetry and re-evaluation of patient's condition.   Medications Ordered in ED Medications  ketorolac (TORADOL) 30 MG/ML injection 30 mg (30 mg Intravenous Given 08/04/17 1443)  HYDROmorphone (DILAUDID) injection 0.5 mg (0.5 mg Intravenous Given 08/04/17 1447)    Or  HYDROmorphone  (DILAUDID) injection 0.5 mg ( Subcutaneous See Alternative 08/04/17 1447)  HYDROmorphone (DILAUDID) injection 1 mg (1 mg Intravenous Given 08/04/17 1525)    Or  HYDROmorphone (DILAUDID) injection 1 mg ( Subcutaneous See Alternative 08/04/17 1525)  HYDROmorphone (DILAUDID) injection 1 mg (1 mg Intravenous Given 08/04/17 1613)    Or  HYDROmorphone (DILAUDID) injection 1 mg ( Subcutaneous See Alternative 08/04/17 1613)  HYDROmorphone (DILAUDID) injection 1 mg (1 mg Intravenous Given 08/04/17 1830)    Or  HYDROmorphone (DILAUDID) injection 1 mg ( Subcutaneous See Alternative 08/04/17 1830)  diphenhydrAMINE (BENADRYL) injection 25 mg (25 mg Intravenous Given 08/04/17 1709)  famotidine (PEPCID) IVPB 20 mg premix (0 mg Intravenous Stopped 08/04/17 1903)     Initial Impression / Assessment and Plan / ED Course  I have reviewed the triage vital signs and the nursing notes.  Pertinent labs & imaging results that were available during my care of the patient were reviewed by me and considered in my medical decision making (see chart for details).    Patient presents with sickle cell pain crisis.  Reports this is similar to her prior pain exacerbations and is out of medication.  Has tried to follow-up with a sickle cell pain clinic, but states they are not accepting any new patients.  She was recently discharged 6/24 in which she had acute chest syndrome.  Patient reports completing her course of antibiotics since discharge and reports that her shortness of breath has greatly improved.  She denies fevers, chills, cough, chest pain.  On exam she is afebrile and nontoxic.  Vital signs stable.  Lungs clear to auscultation.  No signs of injury or trauma to her bilateral legs.  She is neurovascularly intact in bilateral lower extremities.  Sickle cell order set begun.  Lab work reviewed which is unremarkable.  Her hemoglobin is 11.5, which is improved from her baseline.  No leukocytosis.  CMP with elevated T bilirubin, although  this is chronic and stable for her.  Her chest x-ray reveals near complete clearing of previously identified basilar interstitial prominence.  Given patient reports improvement in shortness of breath and chest x-ray reassuring, no concern for acute chest syndrome.  On recheck, patient reports that her pain has improved.  Plan to discharge home.  She does not have any pain medication left at home.  Will discharge with short course of Percocet, but have counseled her about the importance of establishing care with sickle cell clinic or PCP as we cannot manage her chronic sickle cell pain from the ED.  Have given her information to establish care and her discharge paperwork.  Counseled her on reasons to return to the emergency department and she agrees and appears  reliable for follow-up.  Final Clinical Impressions(s) / ED Diagnoses   Final diagnoses:  None    ED Discharge Orders    None       Glyn Ade, PA-C 08/05/17 0019    Glyn Ade, PA-C 08/05/17 Murlean Caller, MD 08/06/17 [phone]

## 2017-08-05 NOTE — Telephone Encounter (Signed)
LVM on mobile number for a return call. Called home number but received a message saying " your call can not be completed at this time, please try your call again later.". Ottis Stain, CMA

## 2017-08-10 NOTE — Telephone Encounter (Signed)
LVM to call office back to be sure that she is aware of her New Patient appointment. Please give her the below information if she calls back. Wendy [name], [name], [name]

## 2017-08-18 ENCOUNTER — Encounter (HOSPITAL_COMMUNITY): Payer: Self-pay

## 2017-08-18 ENCOUNTER — Inpatient Hospital Stay (HOSPITAL_COMMUNITY)
Admission: EM | Admit: 2017-08-18 | Discharge: 2017-08-20 | DRG: 812 | Disposition: A | Discharge status: Home / Self Care | Payer: Medicare Other | Attending: Internal Medicine | Admitting: Internal Medicine

## 2017-08-18 ENCOUNTER — Inpatient Hospital Stay: Payer: Medicare Other | Admitting: Family Medicine

## 2017-08-18 ENCOUNTER — Other Ambulatory Visit: Payer: Self-pay

## 2017-08-18 DIAGNOSIS — D5700 Hb-SS disease with crisis, unspecified: Principal | ICD-10-CM | POA: Diagnosis present

## 2017-08-18 DIAGNOSIS — Z9851 Tubal ligation status: Secondary | ICD-10-CM

## 2017-08-18 DIAGNOSIS — D72829 Elevated white blood cell count, unspecified: Secondary | ICD-10-CM

## 2017-08-18 DIAGNOSIS — R0602 Shortness of breath: Secondary | ICD-10-CM | POA: Diagnosis not present

## 2017-08-18 DIAGNOSIS — Z832 Family history of diseases of the blood and blood-forming organs and certain disorders involving the immune mechanism: Secondary | ICD-10-CM

## 2017-08-18 LAB — RETICULOCYTES
RBC.: 3.25 MIL/uL — ABNORMAL LOW (ref 3.87–5.11)
RETIC COUNT ABSOLUTE: 198.3 10*3/uL — AB (ref 19.0–186.0)
Retic Ct Pct: 6.1 % — ABNORMAL HIGH (ref 0.4–3.1)

## 2017-08-18 LAB — COMPREHENSIVE METABOLIC PANEL
ALBUMIN: 4.9 g/dL (ref 3.5–5.0)
ALT: 17 U/L (ref 0–44)
ANION GAP: 9 (ref 5–15)
AST: 28 U/L (ref 15–41)
Alkaline Phosphatase: 33 U/L — ABNORMAL LOW (ref 38–126)
BUN: 9 mg/dL (ref 6–20)
CO2: 23 mmol/L (ref 22–32)
Calcium: 9.3 mg/dL (ref 8.9–10.3)
Chloride: 107 mmol/L (ref 98–111)
Creatinine, Ser: 0.47 mg/dL (ref 0.44–1.00)
GFR calc Af Amer: >60 mL/min (ref >60)
GFR calc non Af Amer: >60 mL/min (ref >60)
GLUCOSE: 97 mg/dL (ref 70–99)
Potassium: 3.8 mmol/L (ref 3.5–5.1)
Sodium: 139 mmol/L (ref 135–145)
TOTAL PROTEIN: 8 g/dL (ref 6.5–8.1)
Total Bilirubin: 2.3 mg/dL — ABNORMAL HIGH (ref 0.3–1.2)

## 2017-08-18 LAB — CBC WITH DIFFERENTIAL/PLATELET
BASOS PCT: 1 %
Basophils Absolute: 0.1 10*3/uL (ref 0.0–0.1)
Eosinophils Absolute: 0.4 10*3/uL (ref 0.0–0.7)
Eosinophils Relative: 4 %
HCT: 28.7 % — ABNORMAL LOW (ref 36.0–46.0)
Hemoglobin: 9.5 g/dL — ABNORMAL LOW (ref 12.0–15.0)
Lymphocytes Relative: 31 %
Lymphs Abs: 3.3 10*3/uL (ref 0.7–4.0)
MCH: 29.2 pg (ref 26.0–34.0)
MCHC: 33.1 g/dL (ref 30.0–36.0)
MCV: 88.3 fL (ref 78.0–100.0)
MONOS PCT: 7 %
Monocytes Absolute: 0.8 10*3/uL (ref 0.1–1.0)
NEUTROS PCT: 57 %
Neutro Abs: 6.1 10*3/uL (ref 1.7–7.7)
Platelets: 466 10*3/uL — ABNORMAL HIGH (ref 150–400)
RBC: 3.25 MIL/uL — ABNORMAL LOW (ref 3.87–5.11)
RDW: 17.3 % — AB (ref 11.5–15.5)
WBC: 10.6 10*3/uL — ABNORMAL HIGH (ref 4.0–10.5)

## 2017-08-18 MED ORDER — HYDROMORPHONE HCL 1 MG/ML IJ SOLN
0.5000 mg | INTRAMUSCULAR | Status: AC
  Administered 2017-08-18: 0.5 mg via INTRAVENOUS
  Filled 2017-08-18: qty 1

## 2017-08-18 MED ORDER — DIPHENHYDRAMINE HCL 25 MG PO CAPS
25.0000 mg | ORAL_CAPSULE | ORAL | Status: DC | PRN
  Administered 2017-08-19 – 2017-08-20 (×3): 25 mg via ORAL
  Filled 2017-08-18 (×3): qty 1

## 2017-08-18 MED ORDER — HYDROMORPHONE HCL 1 MG/ML IJ SOLN: 0.5000 mg | INTRAMUSCULAR | Status: AC

## 2017-08-18 MED ORDER — HYDROMORPHONE HCL 1 MG/ML IJ SOLN
1.0000 mg | INTRAMUSCULAR | Status: AC
  Administered 2017-08-18: 1 mg via INTRAVENOUS
  Filled 2017-08-18: qty 1

## 2017-08-18 MED ORDER — SODIUM CHLORIDE 0.9% FLUSH: 9.0000 mL | INTRAVENOUS | Status: DC | PRN

## 2017-08-18 MED ORDER — NALOXONE HCL 0.4 MG/ML IJ SOLN: 0.4000 mg | INTRAMUSCULAR | Status: DC | PRN

## 2017-08-18 MED ORDER — DIPHENHYDRAMINE HCL 50 MG/ML IJ SOLN
25.0000 mg | Freq: Once | INTRAMUSCULAR | Status: AC
  Administered 2017-08-18: 25 mg via INTRAVENOUS
  Filled 2017-08-18: qty 1

## 2017-08-18 MED ORDER — ONDANSETRON HCL 4 MG/2ML IJ SOLN: 4.0000 mg | INTRAMUSCULAR | Status: DC | PRN

## 2017-08-18 MED ORDER — KETOROLAC TROMETHAMINE 15 MG/ML IJ SOLN
15.0000 mg | Freq: Four times a day (QID) | INTRAMUSCULAR | Status: DC
  Administered 2017-08-18 – 2017-08-20 (×6): 15 mg via INTRAVENOUS
  Filled 2017-08-18 (×6): qty 1

## 2017-08-18 MED ORDER — HYDROMORPHONE HCL 1 MG/ML IJ SOLN
1.0000 mg | Freq: Once | INTRAMUSCULAR | Status: AC
  Administered 2017-08-18: 1 mg via INTRAVENOUS
  Filled 2017-08-18: qty 1

## 2017-08-18 MED ORDER — SENNOSIDES-DOCUSATE SODIUM 8.6-50 MG PO TABS
1.0000 | ORAL_TABLET | Freq: Two times a day (BID) | ORAL | Status: DC
  Administered 2017-08-20: 1 via ORAL
  Filled 2017-08-18 (×3): qty 1

## 2017-08-18 MED ORDER — DIPHENHYDRAMINE HCL 50 MG/ML IJ SOLN
25.0000 mg | INTRAMUSCULAR | Status: DC | PRN
  Filled 2017-08-18: qty 0.5

## 2017-08-18 MED ORDER — HYDROMORPHONE 1 MG/ML IV SOLN
INTRAVENOUS | Status: DC
  Administered 2017-08-18: 0.3 mg via INTRAVENOUS
  Administered 2017-08-18: 30 mg via INTRAVENOUS
  Administered 2017-08-19: 1.8 mg via INTRAVENOUS
  Administered 2017-08-19: 2.1 mg via INTRAVENOUS
  Administered 2017-08-19: 2.4 mg via INTRAVENOUS
  Administered 2017-08-19: 1.8 mg via INTRAVENOUS
  Administered 2017-08-19 (×2): 2.4 mg via INTRAVENOUS
  Administered 2017-08-20: 1.5 mg via INTRAVENOUS
  Administered 2017-08-20: 3 mg via INTRAVENOUS
  Filled 2017-08-18: qty 30

## 2017-08-18 MED ORDER — HYDROMORPHONE HCL 1 MG/ML IJ SOLN: 1.0000 mg | INTRAMUSCULAR | Status: AC

## 2017-08-18 MED ORDER — SODIUM CHLORIDE 0.45 % IV SOLN
INTRAVENOUS | Status: DC
  Administered 2017-08-18 – 2017-08-20 (×4): via INTRAVENOUS

## 2017-08-18 MED ORDER — ENOXAPARIN SODIUM 40 MG/0.4ML ~~LOC~~ SOLN: 40.0000 mg | SUBCUTANEOUS | Status: DC

## 2017-08-18 MED ORDER — KETOROLAC TROMETHAMINE 30 MG/ML IJ SOLN
15.0000 mg | Freq: Once | INTRAMUSCULAR | Status: AC
  Administered 2017-08-18: 15 mg via INTRAVENOUS
  Filled 2017-08-18: qty 1

## 2017-08-18 MED ORDER — POLYETHYLENE GLYCOL 3350 17 G PO PACK: 17.0000 g | PACK | Freq: Every day | ORAL | Status: DC | PRN

## 2017-08-18 NOTE — ED Provider Notes (Signed)
Patient signed out to me from Dr. Zenia Resides.  27 year old female with sickle cell disease here with a sickle cell pain crisis getting medication from the sickle cell protocol.  They went to evaluate her and she states her pain is still a 9 out of 10 but she is due for her next dose.  She is also run out of her pain medicine.  Clinical Course as of Aug 19 1417  Tue Aug 18, 2017  1822 Patient's had her third dose of Dilaudid and states her pain is improved from a 9 to an 8.  I gave her the option of being discharged now or being admitted to the hospital for further management of her symptoms and she wants to think about it and I will check back with her.   [MB]  1903 Discussed with Dr. Maudie Mercury from the hospitalist service who will admit the patient his service.   [MB]    Clinical Course User Index [MB] Hayden Rasmussen, MD      Hayden Rasmussen, MD 08/19/17 [phone]

## 2017-08-18 NOTE — ED Notes (Signed)
Pt ambulates to restroom

## 2017-08-18 NOTE — ED Notes (Signed)
Delay in lab work, unsuccessful lab draw x1 L arm

## 2017-08-18 NOTE — ED Notes (Signed)
Main lab called to come draw labs

## 2017-08-18 NOTE — ED Notes (Signed)
Failed attempt to collect labs   

## 2017-08-18 NOTE — ED Provider Notes (Addendum)
[facility] DEPT Provider Note   CSN: [id number] Arrival date & time: 08/18/17  1244     History   Chief Complaint Chief Complaint  Patient presents with  . Sickle Cell Pain Crisis  . Shortness of Breath    HPI Wendy [name] is a 27 y.o. female.  27 year old female with history of sickle cell disease presents with usual pain crisis.  Ran out of her Percocet and now has whole body pain.  Denies any fever cough congestion.  She is not short of breath.  Denies any chest pain.  No abdominal discomfort.  No urinary symptoms.  No rashes appreciated.  No neurological findings.  Nothing makes her symptoms better.     Past Medical History:  Diagnosis Date  . Acute chest syndrome ([place]) 07/22/2017  . Anemia   . Sickle cell anemia (HCC)   . Tobacco abuse     Patient Active Problem List   Diagnosis Date Noted  . Acute chest syndrome ([place]) 07/22/2017  . Sickle cell crisis ([place]) 05/12/2017  . Sickle cell pain crisis ([place]) 05/27/2016  . Leukocytosis 05/27/2016  . Sickle cell anemia (HCC)     Past Surgical History:  Procedure Laterality Date  . CESAREAN SECTION    . CESAREAN SECTION WITH BILATERAL TUBAL LIGATION       OB History   None      Home Medications    Prior to Admission medications   Medication Sig Start Date End Date Taking? Authorizing Provider  acetaminophen (TYLENOL) 325 MG tablet Take 2 tablets (650 mg total) by mouth every 6 (six) hours. Patient not taking: Reported on 07/22/2017 05/02/17   Verner Mould, MD  oxyCODONE-acetaminophen (PERCOCET/ROXICET) 5-325 MG tablet Take 1 tablet by mouth every 6 (six) hours as needed for severe pain. 08/04/17   Glyn Ade, PA-C    Family History Family History  Problem Relation Age of Onset  . Sickle cell trait Father     Social History Social History   Tobacco Use  . Smoking status: Never Smoker  . Smokeless tobacco: Never Used  Substance Use Topics  . Alcohol  use: No  . Drug use: No     Allergies   Patient has no known allergies.   Review of Systems Review of Systems  All other systems reviewed and are negative.    Physical Exam Updated Vital Signs BP 105/70   Pulse 80   Temp 97.9 F (36.6 C) (Oral)   Resp 15   Ht 1.676 m (5\' 6" )   Wt 58.5 kg (129 lb)   LMP 07/21/2017   SpO2 100%   BMI 20.82 kg/m   Physical Exam  Constitutional: She is oriented to person, place, and time. She appears well-developed and well-nourished.  Non-toxic appearance. No distress.  HENT:  Head: Normocephalic and atraumatic.  Eyes: Pupils are equal, round, and reactive to light. Conjunctivae, EOM and lids are normal.  Neck: Normal range of motion. Neck supple. No tracheal deviation present. No thyroid mass present.  Cardiovascular: Normal rate, regular rhythm and normal heart sounds. Exam reveals no gallop.  No murmur heard. Pulmonary/Chest: Effort normal and breath sounds normal. No stridor. No respiratory distress. She has no decreased breath sounds. She has no wheezes. She has no rhonchi. She has no rales.  Abdominal: Soft. Normal appearance and bowel sounds are normal. She exhibits no distension. There is no tenderness. There is no rebound and no CVA tenderness.  Musculoskeletal: Normal range of motion. She  exhibits no edema or tenderness.  Neurological: She is alert and oriented to person, place, and time. She has normal strength. No cranial nerve deficit or sensory deficit. GCS eye subscore is 4. GCS verbal subscore is 5. GCS motor subscore is 6.  Skin: Skin is warm and dry. No abrasion and no rash noted.  Psychiatric: She has a normal mood and affect. Her speech is normal and behavior is normal.  Nursing note and vitals reviewed.    ED Treatments / Results  Labs (all labs ordered are listed, but only abnormal results are displayed) Labs Reviewed  CBC WITH DIFFERENTIAL/PLATELET  COMPREHENSIVE METABOLIC PANEL  RETICULOCYTES     EKG None  Radiology No results found.  Procedures Procedures (including critical care time)  Medications Ordered in ED Medications  0.45 % sodium chloride infusion (has no administration in time range)  HYDROmorphone (DILAUDID) injection 0.5 mg (has no administration in time range)    Or  HYDROmorphone (DILAUDID) injection 0.5 mg (has no administration in time range)  HYDROmorphone (DILAUDID) injection 1 mg (has no administration in time range)    Or  HYDROmorphone (DILAUDID) injection 1 mg (has no administration in time range)  HYDROmorphone (DILAUDID) injection 1 mg (has no administration in time range)    Or  HYDROmorphone (DILAUDID) injection 1 mg (has no administration in time range)  HYDROmorphone (DILAUDID) injection 1 mg (has no administration in time range)    Or  HYDROmorphone (DILAUDID) injection 1 mg (has no administration in time range)  ondansetron (ZOFRAN) injection 4 mg (has no administration in time range)  diphenhydrAMINE (BENADRYL) injection 25 mg (has no administration in time range)     Initial Impression / Assessment and Plan / ED Course  I have reviewed the triage vital signs and the nursing notes.  Pertinent labs & imaging results that were available during my care of the patient were reviewed by me and considered in my medical decision making (see chart for details).  Clinical Course as of Sep 11 1919  Tue Aug 18, 2017  1822 Patient's had her third dose of Dilaudid and states her pain is improved from a 9 to an 8.  I gave her the option of being discharged now or being admitted to the hospital for further management of her symptoms and she wants to think about it and I will check back with her.   [MB]  1903 Discussed with Dr. Maudie Mercury from the hospitalist service who will admit the patient his service.   [MB]    Clinical Course User Index [MB] Hayden Rasmussen, MD    Patient given multiple doses of IV hydromorphone.  Also given Toradol.  Pain  remains.  Continue to medicate and reassess when she has had her third dose of hydromorphone.  Dr. Melina Copa to follow-up Final Clinical Impressions(s) / ED Diagnoses   Final diagnoses:  None    ED Discharge Orders    None       Lacretia Leigh, MD 08/18/17 1556    Lacretia Leigh, MD 09/10/17 1921

## 2017-08-18 NOTE — Telephone Encounter (Signed)
Pt has an apt with Dr. Criss Rosales today. I have given a note to his CMA to remind him to confirm [facility]. Ottis Stain, CMA

## 2017-08-18 NOTE — ED Notes (Signed)
Attempted to call report. Receiving nurse will call back when about to receive report.

## 2017-08-18 NOTE — Progress Notes (Signed)
ED TO INPATIENT HANDOFF REPORT  Name/Age/Gender Wendy [name] 27 y.o. female  Code Status    Code Status Orders  (From admission, onward)        Start     Ordered   08/18/17 2024  Full code  Continuous     08/18/17 2027    Code Status History    Date Active Date Inactive Code Status Order ID Comments User Context   07/22/2017 2021 07/27/2017 2147 Full Code [id number]  Jani Gravel, MD ED   07/19/2017 2036 07/20/2017 1604 Full Code [id number]  Rise Patience, MD ED   05/13/2017 0100 05/18/2017 1638 Full Code [id number]  Merton Border, MD Inpatient   04/30/2017 2301 05/02/2017 1751 Full Code [id number]  Sherene Sires, DO ED   05/28/2016 0004 06/01/2016 1607 Full Code [id number]  Ivor Costa, MD ED      Home/SNF/Other Home  Chief Complaint sickle cell   Level of Care/Admitting Diagnosis ED Disposition    ED Disposition Condition [facility] Area: [facility] [[id number]]  Level of Care: Telemetry [5]  Admit to tele based on following criteria: Monitor for Ischemic changes  Diagnosis: Sickle cell crisis [facility]) [[id number]]  Admitting Physician: Jani Gravel [[id number]]  Attending Physician: Jani Gravel [phone]  Estimated length of stay: past midnight tomorrow  Certification:: I certify this patient will need inpatient services for at least 2 midnights  PT Class (Do Not Modify): Inpatient [101]  PT Acc Code (Do Not Modify): Private [1]       Medical History Past Medical History:  Diagnosis Date  . Acute chest syndrome ([place]) 07/22/2017  . Anemia   . Sickle cell anemia (HCC)   . Tobacco abuse     Allergies No Known Allergies  IV Location/Drains/Wounds Patient Lines/Drains/Airways Status   Active Line/Drains/Airways    Name:   Placement date:   Placement time:   Site:   Days:   Peripheral IV 08/18/17 Left Hand   08/18/17    1320    Hand   less than 1          Labs/Imaging Results for orders placed or performed during the hospital encounter of  08/18/17 (from the past 48 hour(s))  CBC WITH DIFFERENTIAL     Status: Abnormal   Collection Time: 08/18/17  3:06 PM  Result Value Ref Range   WBC 10.6 (H) 4.0 - 10.5 K/uL   RBC 3.25 (L) 3.87 - 5.11 MIL/uL   Hemoglobin 9.5 (L) 12.0 - 15.0 g/dL   HCT 28.7 (L) 36.0 - 46.0 %   MCV 88.3 78.0 - 100.0 fL   MCH 29.2 26.0 - 34.0 pg   MCHC 33.1 30.0 - 36.0 g/dL   RDW 17.3 (H) 11.5 - 15.5 %   Platelets 466 (H) 150 - 400 K/uL   Neutrophils Relative % 57 %   Neutro Abs 6.1 1.7 - 7.7 K/uL   Lymphocytes Relative 31 %   Lymphs Abs 3.3 0.7 - 4.0 K/uL   Monocytes Relative 7 %   Monocytes Absolute 0.8 0.1 - 1.0 K/uL   Eosinophils Relative 4 %   Eosinophils Absolute 0.4 0.0 - 0.7 K/uL   Basophils Relative 1 %   Basophils Absolute 0.1 0.0 - 0.1 K/uL    Comment: Performed at [facility], [place] [address] [place], [place] [address]  Comprehensive metabolic panel     Status: Abnormal   Collection Time: 08/18/17  3:06 PM  Result Value Ref Range  Sodium 139 135 - 145 mmol/L   Potassium 3.8 3.5 - 5.1 mmol/L   Chloride 107 98 - 111 mmol/L    Comment: Please note change in reference range.   CO2 23 22 - 32 mmol/L   Glucose, Bld 97 70 - 99 mg/dL    Comment: Please note change in reference range.   BUN 9 6 - 20 mg/dL    Comment: Please note change in reference range.   Creatinine, Ser 0.47 0.44 - 1.00 mg/dL   Calcium 9.3 8.9 - 10.3 mg/dL   Total Protein 8.0 6.5 - 8.1 g/dL   Albumin 4.9 3.5 - 5.0 g/dL   AST 28 15 - 41 U/L   ALT 17 0 - 44 U/L    Comment: Please note change in reference range.   Alkaline Phosphatase 33 (L) 38 - 126 U/L   Total Bilirubin 2.3 (H) 0.3 - 1.2 mg/dL   GFR calc non Af Amer >60 >60 mL/min   GFR calc Af Amer >60 >60 mL/min    Comment: (NOTE) The eGFR has been calculated using the CKD EPI equation. This calculation has not been validated in all clinical situations. eGFR's persistently <60 mL/min signify possible Chronic Kidney Disease.    Anion gap 9 5  - 15    Comment: Performed at [facility], [place] [address], [place], [place] [address]  Reticulocytes     Status: Abnormal   Collection Time: 08/18/17  3:06 PM  Result Value Ref Range   Retic Ct Pct 6.1 (H) 0.4 - 3.1 %   RBC. 3.25 (L) 3.87 - 5.11 MIL/uL   Retic Count, Absolute 198.3 (H) 19.0 - 186.0 K/uL    Comment: Performed at [facility], [place] [address], [place], [place] [address]   No results found.  Pending Labs Unresulted Labs (From admission, onward)   Start     Ordered   08/25/17 0500  Creatinine, serum  (enoxaparin (LOVENOX)    CrCl >/= 30 ml/min)  Weekly,   R    Comments:  while on enoxaparin therapy    08/18/17 2027   08/19/17 0500  Comprehensive metabolic panel  Tomorrow morning,   R     08/18/17 2027   08/19/17 0500  CBC  Tomorrow morning,   R     08/18/17 2027      Vitals/Pain Today's Vitals   08/18/17 1821 08/18/17 1959 08/18/17 2002 08/18/17 2058  BP:  102/62 102/62   Pulse:  81 90   Resp:  12 15   Temp:      TempSrc:      SpO2:  100% 100%   Weight:      Height:      PainSc: 8    9     Isolation Precautions No active isolations  Medications Medications  0.45 % sodium chloride infusion ( Intravenous New Bag/Given 08/18/17 1441)  HYDROmorphone (DILAUDID) injection 1 mg (1 mg Intravenous Given 08/18/17 1736)    Or  HYDROmorphone (DILAUDID) injection 1 mg ( Subcutaneous [name] Alternative 08/18/17 1736)  ondansetron (ZOFRAN) injection 4 mg (has no administration in time range)  senna-docusate (Senokot-S) tablet 1 tablet (has no administration in time range)  polyethylene glycol (MIRALAX / GLYCOLAX) packet 17 g (has no administration in time range)  enoxaparin (LOVENOX) injection 40 mg (has no administration in time range)  ketorolac (TORADOL) 15 MG/ML injection 15 mg (has no administration in time range)  diphenhydrAMINE (BENADRYL) capsule 25 mg (has no administration  in time range)    Or  diphenhydrAMINE (BENADRYL)  25 mg in sodium chloride 0.9 % 50 mL IVPB (has no administration in time range)  naloxone (NARCAN) injection 0.4 mg (has no administration in time range)    And  sodium chloride flush (NS) 0.9 % injection 9 mL (has no administration in time range)  HYDROmorphone (DILAUDID) 1 mg/mL PCA injection (has no administration in time range)  HYDROmorphone (DILAUDID) injection 1 mg (has no administration in time range)  HYDROmorphone (DILAUDID) injection 0.5 mg (0.5 mg Intravenous Given 08/18/17 1440)    Or  HYDROmorphone (DILAUDID) injection 0.5 mg ( Subcutaneous [name] Alternative 08/18/17 1440)  HYDROmorphone (DILAUDID) injection 1 mg (1 mg Intravenous Given 08/18/17 1510)    Or  HYDROmorphone (DILAUDID) injection 1 mg ( Subcutaneous [name] Alternative 08/18/17 1510)  HYDROmorphone (DILAUDID) injection 1 mg (1 mg Intravenous Given 08/18/17 1612)    Or  HYDROmorphone (DILAUDID) injection 1 mg ( Subcutaneous [name] Alternative 08/18/17 1612)  diphenhydrAMINE (BENADRYL) injection 25 mg (25 mg Intravenous Given 08/18/17 1437)  ketorolac (TORADOL) 30 MG/ML injection 15 mg (15 mg Intravenous Given 08/18/17 1735)  diphenhydrAMINE (BENADRYL) injection 25 mg (25 mg Intravenous Given 08/18/17 1734)    Mobility walks

## 2017-08-18 NOTE — H&P (Signed)
TRH H&P   Patient Demographics:    Wendy [name], is a 27 y.o. female  MRN: [id number]   DOB - 05-19-1990  Admit Date - 08/18/2017  Outpatient Primary MD for the patient is Patient, No Pcp Per  Referring MD/NP/PA:  Dr. Melina Copa  Outpatient Specialists:   Patient coming from: home  Chief Complaint  Patient presents with  . Sickle Cell Pain Crisis  . Shortness of Breath      HPI:    Wendy [name]  is a 27 y.o. female, w sickle cell apparently c/o missing out on her medication for the past 2 days.  Pt states that she hurts all over  (arms , legs, back, hip).  Pt states that her pain level 9/10,  Pt states pain consistent with her sickle cell crisis pain.   In Ed.  wbc 10.6, hgb 9.5, Plt 466  Na 139, K 3.8 Bun 9, Creatinine 0.47 Ast 28, Alt 17 Alk phos 33, T. Bili 2.3  retic 6.1  Pt will be admitted for sickle cell crisis.     Review of systems:    In addition to the HPI above,   No Fever-chills, No Headache, No changes with Vision or hearing, No problems swallowing food or Liquids, No Chest pain, Cough or Shortness of Breath, No Abdominal pain, No Nausea or Vommitting, Bowel movements are regular, No Blood in stool or Urine, No dysuria, No new skin rashes or bruises,  No new weakness, tingling, numbness in any extremity, No recent weight gain or loss, No polyuria, polydypsia or polyphagia, No significant Mental Stressors.  A full 10 point Review of Systems was done, except as stated above, all other Review of Systems were negative.   With Past History of the following :    Past Medical History:  Diagnosis Date  . Acute chest syndrome ([place]) 07/22/2017  . Anemia   . Sickle cell anemia (HCC)   . Tobacco abuse       Past Surgical History:  Procedure Laterality Date  . CESAREAN SECTION    . CESAREAN SECTION WITH BILATERAL TUBAL LIGATION        Social History:     Social History   Tobacco Use  . Smoking status: Never Smoker  . Smokeless tobacco: Never Used  Substance Use Topics  . Alcohol use: No     Lives - at home w sister Mobility -  Walks by self   Family History :     Family History  Problem Relation Age of Onset  . Sickle cell trait Father      Home Medications:   Prior to Admission medications   Medication Sig Start Date End Date Taking? Authorizing Provider  oxyCODONE-acetaminophen (PERCOCET/ROXICET) 5-325 MG tablet Take 1 tablet by mouth every 6 (six) hours as needed for severe pain. 08/04/17  Yes Glyn Ade, PA-C     Allergies:  No Known Allergies   Physical Exam:   Vitals  Blood pressure 102/62, pulse 81, temperature (!) 97.3 F (36.3 C), temperature source Oral, resp. rate 12, height 5' 6"  (1.676 m), weight 58.5 kg (129 lb), last menstrual period 07/21/2017, SpO2 100 %.   1. General   lying in bed in NAD,    2. Normal affect and insight, Not Suicidal or Homicidal, Awake Alert, Oriented X 3.  3. No F.N deficits, ALL C.Nerves Intact, Strength 5/5 all 4 extremities, Sensation intact all 4 extremities, Plantars down going.  4. Ears and Eyes appear Normal, Conjunctivae clear, PERRLA. Moist Oral Mucosa.  5. Supple Neck, No JVD, No cervical lymphadenopathy appriciated, No Carotid Bruits.  6. Symmetrical Chest wall movement, Good air movement bilaterally, CTAB.  7. RRR, No Gallops, Rubs or Murmurs, No Parasternal Heave.  8. Positive Bowel Sounds, Abdomen Soft, No tenderness, No organomegaly appriciated,No rebound -guarding or rigidity.  9.  No Cyanosis, Normal Skin Turgor, No Skin Rash or Bruise.  10. Good muscle tone,  joints appear normal , no effusions, Normal ROM.  11. No Palpable Lymph Nodes in Neck or Axillae      Data Review:    CBC Recent Labs  Lab 08/18/17 1506  WBC 10.6*  HGB 9.5*  HCT 28.7*  PLT 466*  MCV 88.3  MCH 29.2  MCHC 33.1  RDW 17.3*  LYMPHSABS  3.3  MONOABS 0.8  EOSABS 0.4  BASOSABS 0.1   ------------------------------------------------------------------------------------------------------------------  Chemistries  Recent Labs  Lab 08/18/17 1506  NA 139  K 3.8  CL 107  CO2 23  GLUCOSE 97  BUN 9  CREATININE 0.47  CALCIUM 9.3  AST 28  ALT 17  ALKPHOS 33*  BILITOT 2.3*   ------------------------------------------------------------------------------------------------------------------ estimated creatinine clearance is 97.6 mL/min (by C-G formula based on SCr of 0.47 mg/dL). ------------------------------------------------------------------------------------------------------------------ No results for input(s): TSH, T4TOTAL, T3FREE, THYROIDAB in the last 72 hours.  Invalid input(s): FREET3  Coagulation profile No results for input(s): INR, PROTIME in the last 168 hours. ------------------------------------------------------------------------------------------------------------------- No results for input(s): DDIMER in the last 72 hours. -------------------------------------------------------------------------------------------------------------------  Cardiac Enzymes No results for input(s): CKMB, TROPONINI, MYOGLOBIN in the last 168 hours.  Invalid input(s): CK ------------------------------------------------------------------------------------------------------------------ No results found for: BNP   ---------------------------------------------------------------------------------------------------------------  Urinalysis    Component Value Date/Time   COLORURINE YELLOW 04/30/2017 2055   APPEARANCEUR CLEAR 04/30/2017 2055   LABSPEC 1.005 04/30/2017 2055   PHURINE 7.0 04/30/2017 2055   GLUCOSEU NEGATIVE 04/30/2017 2055   HGBUR SMALL (A) 04/30/2017 2055   BILIRUBINUR NEGATIVE 04/30/2017 2055   [place] NEGATIVE 04/30/2017 2055   PROTEINUR NEGATIVE 04/30/2017 2055   NITRITE NEGATIVE 04/30/2017 2055    LEUKOCYTESUR SMALL (A) 04/30/2017 2055    ----------------------------------------------------------------------------------------------------------------   Imaging Results:    No results found.     Assessment & Plan:    Principal Problem:   Sickle cell crisis (HCC)    Sickle cell crisis Ns iv o2 [place] Dilaudid pca Benadryl  zofran 68m iv q6h prn      DVT Prophylaxis Lovenox - SCDs    AM Labs Ordered, also please review Full Orders  Family Communication: Admission, patients condition and plan of care including tests being ordered have been discussed with the patient  who indicate understanding and agree with the plan and Code Status.  Code Status  FULL CODE  Likely DC to   home  Condition GUARDED    Consults called: none  Admission status:  inpatient   Time spent in  minutes : 60   Jani Gravel M.D on 08/18/2017 at 8:14 PM  Between 7am to 7pm - Pager - [phone]  . After 7pm go to [email or web address] - password [facility]  [facility] - Office  [phone]

## 2017-08-18 NOTE — ED Notes (Signed)
Bed: WA14 Expected date:  Expected time:  Means of arrival:  Comments: EMS 

## 2017-08-18 NOTE — ED Notes (Signed)
Attempted to find the patients bed and call report. Will call again.

## 2017-08-18 NOTE — ED Triage Notes (Addendum)
Pt c/o pain all over x4 days r/t sickle cell. Pt also c/o shortness of breath with EMS, 99% room air per EMS, but placed on 2L/min [place] for comfort. Pt on room air currently, speaking in full sentences, no apparent distress. Reports not taking anything for pain today as she has ran out.

## 2017-08-19 LAB — CBC
HEMATOCRIT: 26.3 % — AB (ref 36.0–46.0)
Hemoglobin: 8.7 g/dL — ABNORMAL LOW (ref 12.0–15.0)
MCH: 29.2 pg (ref 26.0–34.0)
MCHC: 33.1 g/dL (ref 30.0–36.0)
MCV: 88.3 fL (ref 78.0–100.0)
Platelets: 433 10*3/uL — ABNORMAL HIGH (ref 150–400)
RBC: 2.98 MIL/uL — AB (ref 3.87–5.11)
RDW: 18.2 % — ABNORMAL HIGH (ref 11.5–15.5)
WBC: 13.6 10*3/uL — ABNORMAL HIGH (ref 4.0–10.5)

## 2017-08-19 LAB — COMPREHENSIVE METABOLIC PANEL
ALT: 16 U/L (ref 0–44)
ANION GAP: 7 (ref 5–15)
AST: 27 U/L (ref 15–41)
Albumin: 4.3 g/dL (ref 3.5–5.0)
Alkaline Phosphatase: 34 U/L — ABNORMAL LOW (ref 38–126)
BUN: 11 mg/dL (ref 6–20)
CHLORIDE: 106 mmol/L (ref 98–111)
CO2: 25 mmol/L (ref 22–32)
Calcium: 8.8 mg/dL — ABNORMAL LOW (ref 8.9–10.3)
Creatinine, Ser: 0.49 mg/dL (ref 0.44–1.00)
GFR calc non Af Amer: >60 mL/min (ref >60)
GLUCOSE: 97 mg/dL (ref 70–99)
Potassium: 3.9 mmol/L (ref 3.5–5.1)
SODIUM: 138 mmol/L (ref 135–145)
Total Bilirubin: 2.6 mg/dL — ABNORMAL HIGH (ref 0.3–1.2)
Total Protein: 6.9 g/dL (ref 6.5–8.1)

## 2017-08-19 MED ORDER — HYDROMORPHONE HCL 1 MG/ML IJ SOLN
1.0000 mg | Freq: Once | INTRAMUSCULAR | Status: AC
  Administered 2017-08-19: 1 mg via INTRAVENOUS
  Filled 2017-08-19: qty 1

## 2017-08-19 MED ORDER — OXYCODONE-ACETAMINOPHEN 5-325 MG PO TABS
1.0000 | ORAL_TABLET | Freq: Four times a day (QID) | ORAL | Status: DC | PRN
  Administered 2017-08-19 – 2017-08-20 (×3): 1 via ORAL
  Filled 2017-08-19 (×3): qty 1

## 2017-08-19 MED ORDER — FOLIC ACID 5 MG/ML IJ SOLN
1.0000 mg | Freq: Every day | INTRAMUSCULAR | Status: DC
  Administered 2017-08-19 – 2017-08-20 (×2): 1 mg via INTRAVENOUS
  Filled 2017-08-19 (×2): qty 0.2

## 2017-08-19 NOTE — Progress Notes (Cosign Needed)
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 Patient continues to have pain in lower back and extremities. She recently relocated to area from [place], [place] and does not have a PCP. She says that she typically manages sickle cell crises at home, but does not have pain medications. She says that pain intensity is 9/10 characterized as constant and sharp. Patient resting comfortably. She is currently afebrile. She denies headache, chest pain, heart palpitation, nausea, vomiting,or diarrhea.   Objective:  Vital signs in last 24 hours:  Vitals:   08/19/17 0400 08/19/17 0551 08/19/17 0759 08/19/17 1059  BP:  121/70  101/67  Pulse:  91  80  Resp: 19 15 12    Temp:  97.8 F (36.6 C)  97.7 F (36.5 C)  TempSrc:  Oral    SpO2: 92% 95% 99% 100%  Weight:      Height:        Intake/Output from previous day:   Intake/Output Summary (Last 24 hours) at 08/19/2017 1245 Last data filed at 08/19/2017 0900 Gross per 24 hour  Intake 2417.5 ml  Output -  Net 2417.5 ml    Physical Exam: General: Alert, awake, oriented x3, in no acute distress.  HEENT: [place]/AT PEERL, EOMI Neck: Trachea midline,  no masses, no thyromegal,y no JVD, no carotid bruit OROPHARYNX:  Moist, No exudate/ erythema/lesions.  Heart: Regular rate and rhythm, without murmurs, rubs, gallops, PMI non-displaced, no heaves or thrills on palpation.  Lungs: Clear to auscultation, no wheezing or rhonchi noted. No increased vocal fremitus resonant to percussion  Abdomen: Soft, nontender, nondistended, positive bowel sounds, no masses no hepatosplenomegaly noted..  Neuro: No focal neurological deficits noted cranial nerves II through XII grossly intact.  Musculoskeletal: No warm swelling or erythema around joints, no spinal tenderness noted. Psychiatric: Patient alert and oriented x3, good insight and cognition, good recent to remote recall. Lymph node  survey: No cervical axillary or inguinal lymphadenopathy noted.  Lab Results:  Basic Metabolic Panel:    Component Value Date/Time   NA 138 08/19/2017 0428   K 3.9 08/19/2017 0428   CL 106 08/19/2017 0428   CO2 25 08/19/2017 0428   BUN 11 08/19/2017 0428   CREATININE 0.49 08/19/2017 0428   GLUCOSE 97 08/19/2017 0428   CALCIUM 8.8 (L) 08/19/2017 0428   CBC:    Component Value Date/Time   WBC 13.6 (H) 08/19/2017 0428   HGB 8.7 (L) 08/19/2017 0428   HCT 26.3 (L) 08/19/2017 0428   PLT 433 (H) 08/19/2017 0428   MCV 88.3 08/19/2017 0428   NEUTROABS 6.1 08/18/2017 1506   LYMPHSABS 3.3 08/18/2017 1506   MONOABS 0.8 08/18/2017 1506   EOSABS 0.4 08/18/2017 1506   BASOSABS 0.1 08/18/2017 1506    No results found for this or any previous visit (from the past 240 hour(s)).  Studies/Results: No results found.  Medications: Scheduled Meds: . enoxaparin (LOVENOX) injection  40 mg Subcutaneous Q24H  . HYDROmorphone   Intravenous Q4H  . ketorolac  15 mg Intravenous Q6H  . senna-docusate  1 tablet Oral BID   Continuous Infusions: . sodium chloride 150 mL/hr at 08/18/17 2244  . diphenhydrAMINE     PRN Meds:.diphenhydrAMINE **OR** diphenhydrAMINE, naloxone **AND** sodium chloride flush, ondansetron, polyethylene glycol  Assessment/Plan: Principal Problem:   Sickle cell crisis (HCC)   Hb Sickle Cell Disease with crisis:  Continue IVF 0.45% Saline @ 125 mls/hour Continue custom dose  Dilaudid PCA,  IV Toradol 30 mg Q 6 H,  Monitor vitals very closely, Re-evaluate pain scale regularly.  Sickle Cell Anemia:  Folic acid 1 mg    DVT: Lovenox Code Status: Full Code Family Communication: N/A Disposition Plan: Not yet ready for discharge    Donia Pounds  MSN, FNP-C Patient [place], [place] [address] [phone]  If 7PM-7AM, please contact night-coverage.  08/19/2017, 12:45 PM  LOS: 1 day

## 2017-08-20 DIAGNOSIS — D72829 Elevated white blood cell count, unspecified: Secondary | ICD-10-CM

## 2017-08-20 MED ORDER — OXYCODONE-ACETAMINOPHEN 5-325 MG PO TABS: 1.0000 | ORAL_TABLET | Freq: Four times a day (QID) | ORAL | 0 refills | Status: DC | PRN

## 2017-08-20 MED ORDER — FOLIC ACID 1 MG PO TABS: 1.0000 mg | ORAL_TABLET | Freq: Every day | ORAL | 11 refills | Status: AC

## 2017-08-20 MED ORDER — FOLIC ACID 1 MG PO TABS: 1.0000 mg | ORAL_TABLET | Freq: Every day | ORAL | Status: DC

## 2017-08-20 NOTE — Progress Notes (Signed)
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 Patient continues to have pain in lower back and extremities. Pain intensity improved minimally overnight.  Patient resting comfortably and eating breakfast. She is currently afebrile. She has been ambulating in room without assistance.  She denies headache, chest pain, heart palpitation, nausea, vomiting,or diarrhea.   Objective:  Vital signs in last 24 hours:  Vitals:   08/20/17 0149 08/20/17 0524 08/20/17 0604 08/20/17 1006  BP: (!) 96/58  (!) 107/58 107/70  Pulse: 95  100 90  Resp: 16 16 16 17   Temp: 98 F (36.7 C)  97.9 F (36.6 C) 97.8 F (36.6 C)  TempSrc: Oral  Oral Oral  SpO2: 94% 92% 93% 98%  Weight:      Height:        Intake/Output from previous day:   Intake/Output Summary (Last 24 hours) at 08/20/2017 1052 Last data filed at 08/20/2017 0400 Gross per 24 hour  Intake 2267.5 ml  Output -  Net 2267.5 ml    Physical Exam: General: Alert, awake, oriented x3, in no acute distress.  HEENT: [place]/AT PEERL, EOMI Neck: Trachea midline,  no masses, no thyromegal,y no JVD, no carotid bruit OROPHARYNX:  Moist, No exudate/ erythema/lesions.  Heart: Regular rate and rhythm, without murmurs, rubs, gallops, PMI non-displaced, no heaves or thrills on palpation.  Lungs: Clear to auscultation, no wheezing or rhonchi noted. No increased vocal fremitus resonant to percussion  Abdomen: Soft, nontender, nondistended, positive bowel sounds, no masses no hepatosplenomegaly noted..  Neuro: No focal neurological deficits noted cranial nerves II through XII grossly intact.  Musculoskeletal: No warm swelling or erythema around joints, no spinal tenderness noted. Psychiatric: Patient alert and oriented x3, good insight and cognition, good recent to remote recall. Lymph node survey: No cervical axillary or inguinal lymphadenopathy noted.  Lab Results:  Basic  Metabolic Panel:    Component Value Date/Time   NA 138 08/19/2017 0428   K 3.9 08/19/2017 0428   CL 106 08/19/2017 0428   CO2 25 08/19/2017 0428   BUN 11 08/19/2017 0428   CREATININE 0.49 08/19/2017 0428   GLUCOSE 97 08/19/2017 0428   CALCIUM 8.8 (L) 08/19/2017 0428   CBC:    Component Value Date/Time   WBC 13.6 (H) 08/19/2017 0428   HGB 8.7 (L) 08/19/2017 0428   HCT 26.3 (L) 08/19/2017 0428   PLT 433 (H) 08/19/2017 0428   MCV 88.3 08/19/2017 0428   NEUTROABS 6.1 08/18/2017 1506   LYMPHSABS 3.3 08/18/2017 1506   MONOABS 0.8 08/18/2017 1506   EOSABS 0.4 08/18/2017 1506   BASOSABS 0.1 08/18/2017 1506    No results found for this or any previous visit (from the past 240 hour(s)).  Studies/Results: No results found.  Medications: Scheduled Meds: . enoxaparin (LOVENOX) injection  40 mg Subcutaneous Q24H  . folic acid  1 mg Intravenous Daily  . HYDROmorphone   Intravenous Q4H  . ketorolac  15 mg Intravenous Q6H  . senna-docusate  1 tablet Oral BID   Continuous Infusions: . sodium chloride 100 mL/hr at 08/20/17 0854  . diphenhydrAMINE     PRN Meds:.diphenhydrAMINE **OR** diphenhydrAMINE, naloxone **AND** sodium chloride flush, ondansetron, oxyCODONE-acetaminophen, polyethylene glycol  Assessment/Plan: Principal Problem:   Sickle cell crisis (HCC)   Hb Sickle Cell Disease with crisis:  Continue IVF 0.45% Saline @ 125 mls/hour Continue custom dose Dilaudid PCA,  IV Toradol 30 mg Q 6 H,  Monitor vitals very closely, Re-evaluate pain scale regularly. Percocet 5-325 mg every 6 hours as needed for breakthrough pain.   Sickle Cell Anemia:  Folic acid 1 mg    DVT: Lovenox Code Status: Full Code Family Communication: N/A Disposition Plan: Not yet ready for discharge    Donia Pounds  MSN, FNP-C Patient [place], [place] [address] [phone]  If 7PM-7AM, please contact  night-coverage.  08/20/2017, 10:52 AM  LOS: 2 days

## 2017-08-20 NOTE — Discharge Summary (Signed)
Physician Discharge Summary  Wendy [name] XBW:[id number] DOB: 02/02/1991 DOA: 08/18/2017  PCP: Patient, No Pcp Per  Admit date: 08/18/2017  Discharge date: 08/20/2017  Discharge Diagnoses:  Principal Problem:   Sickle cell anemia with pain [facility])   Discharge Condition: Stable  Disposition:  Pt is discharged home in good condition and is to follow up with the [facility] on 08/31/2017  to have labs evaluated. Wendy [name] is instructed to increase activity slowly and balance with rest for the next few days, and use prescribed medication to complete treatment of pain  Diet: Regular Wt Readings from Last 3 Encounters:  08/18/17 129 lb (58.5 kg)  08/04/17 129 lb 10.1 oz (58.8 kg)  07/23/17 132 lb 11.5 oz (60.2 kg)    History of present illness:  Wendy [name], a 27 year old patient with a history of sickle cell anemia, HbSS that presented to the emergency room with generalized pain. Patient says that she relocated from [place], [place] and does not have a primary provider. She has been out of opiate medications over the past several weeks. Pain intensity on admission was 9/10.   Hospital Course:  Patient was admitted for sickle cell pain crisis and managed appropriately with IVF, IV Dilaudid via PCA and IV Toradol, as well as other adjunct therapies per sickle cell pain management protocols.  Patient was discharged home today in a hemodynamically stable condition.   Discharge Exam: Vitals:   08/20/17 0604 08/20/17 1006  BP: (!) 107/58 107/70  Pulse: 100 90  Resp: 16 17  Temp: 97.9 F (36.6 C) 97.8 F (36.6 C)  SpO2: 93% 98%   Vitals:   08/20/17 0149 08/20/17 0524 08/20/17 0604 08/20/17 1006  BP: (!) 96/58  (!) 107/58 107/70  Pulse: 95  100 90  Resp: 16 16 16 17   Temp: 98 F (36.7 C)  97.9 F (36.6 C) 97.8 F (36.6 C)  TempSrc: Oral  Oral Oral  SpO2: 94% 92% 93% 98%  Weight:      Height:        General appearance : Awake, alert, not in any distress. Speech  Clear. Not toxic looking HEENT: Atraumatic and Normocephalic, pupils equally reactive to light and accomodation Neck: Supple, no JVD. No cervical lymphadenopathy.  Chest: Good air entry bilaterally, no added sounds  CVS: S1 S2 regular, no murmurs.  Abdomen: Bowel sounds present, Non tender and not distended with no gaurding, rigidity or rebound. Extremities: B/L Lower Ext shows no edema, both legs are warm to touch Neurology: Awake alert, and oriented X 3, CN II-XII intact, Non focal Skin: No Rash  Discharge Instructions  Discharge Instructions    Discharge patient   Complete by:  As directed    Discharge disposition:  01-Home or Self Care   Discharge patient date:  08/20/2017     Allergies as of 08/20/2017   No Known Allergies     Medication List    TAKE these medications   folic acid 1 MG tablet Commonly known as:  FOLVITE Take 1 tablet (1 mg total) by mouth daily.   oxyCODONE-acetaminophen 5-325 MG tablet Commonly known as:  PERCOCET/ROXICET Take 1 tablet by mouth every 6 (six) hours as needed for severe pain.       The results of significant diagnostics from this hospitalization (including imaging, microbiology, ancillary and laboratory) are listed below for reference.    Significant Diagnostic Studies: Dg Chest 2 View  Result Date: 08/04/2017 CLINICAL DATA:  Generalized body aches.  Shortness  of breath. EXAM: CHEST - 2 VIEW COMPARISON:  07/27/2017. FINDINGS: Mediastinum hilar structures normal. Interim near complete clearing of previously identified basilar interstitial prominence. No pleural effusion or pneumothorax. Thoracic spine scoliosis and degenerative change. IMPRESSION: Interim near complete clearing of previously identified basilar interstitial prominence. No acute new infiltrates are noted. Electronically Signed   By: Marcello Moores  Register   On: 08/04/2017 15:22   Dg Chest 2 View  Result Date: 07/27/2017 CLINICAL DATA:  Sickle cell anemia, acute chest syndrome  with shortness of breath. EXAM: CHEST - 2 VIEW COMPARISON:  Chest x-ray of May 19, 2017 and chest CT scan of January 21, 2018. FINDINGS: The lungs are adequately inflated. The interstitial markings are more prominent than in April but are similar to those seen previously. The lung markings are greatest in the lower lung zones bilaterally. The cardiac silhouette is mildly enlarged and the pulmonary vascularity engorged. The mediastinum is normal in width. There is a small left pleural effusion. IMPRESSION: Bilateral interstitial prominence suggests a combination of mild CHF with interstitial pneumonia, or edema secondary to the acute chest syndrome. Electronically Signed   By: David  Martinique M.D.   On: 07/27/2017 11:04   Ct Angio Chest Pe W And/or Wo Contrast  Result Date: 07/22/2017 CLINICAL DATA:  Dyspnea and body pain x2 days. History of sickle cell anemia and sickle cell crisis. EXAM: CT ANGIOGRAPHY CHEST WITH CONTRAST TECHNIQUE: Multidetector CT imaging of the chest was performed using the standard protocol during bolus administration of intravenous contrast. Multiplanar CT image reconstructions and MIPs were obtained to evaluate the vascular anatomy. CONTRAST:  133mL ISOVUE-370 IOPAMIDOL (ISOVUE-370) INJECTION 76% COMPARISON:  05/19/2017 FINDINGS: Cardiovascular: The study is of quality for the evaluation of pulmonary embolism. There are no filling defects in the central, lobar, segmental or subsegmental pulmonary artery branches to suggest acute pulmonary embolism. Cardiomegaly is redemonstrated without evidence of pericardial effusion. Motion related artifacts limit assessment of the aortic root and ascending aorta. No aneurysmal dilatation is identified. No definite evidence of dissection given motion artifacts. Mediastinum/Nodes: No discrete thyroid nodules. Unremarkable esophagus. No pathologically enlarged axillary, mediastinal or hilar lymph nodes. Lungs/Pleura: No pneumothorax. No pleural  effusion. Multilobar airspace opacities most of which are ground-glass in appearance are identified and more confluent in both lower lobes. Findings may represent stigmata of acute chest syndrome given history of sickle cell disease. Alternatively, multilobar pneumonia is a possibility as well. No effusion or pneumothorax. Upper abdomen: Unremarkable. Musculoskeletal: No aggressive appearing focal osseous lesions. 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 Review of the MIP images confirms the above findings. IMPRESSION: 1. Cardiomegaly with H-shaped thoracic vertebrae in keeping with stigmata from patient's history of sickle cell disease. 2. Diffuse multilobar but lower lobe predominant ground-glass opacities suspicious for acute chest syndrome. Alternatively, multilobar pneumonia is a possibility. 3. No acute pulmonary embolus. Electronically Signed   By: Ashley Royalty M.D.   On: 07/22/2017 18:43    Microbiology: No results found for this or any previous visit (from the past 240 hour(s)).   Labs: Basic Metabolic Panel: Recent Labs  Lab 08/18/17 1506 08/19/17 0428  NA 139 138  K 3.8 3.9  CL 107 106  CO2 23 25  GLUCOSE 97 97  BUN 9 11  CREATININE 0.47 0.49  CALCIUM 9.3 8.8*   Liver Function Tests: Recent Labs  Lab 08/18/17 1506 08/19/17 0428  AST 28 27  ALT 17 16  ALKPHOS 33* 34*  BILITOT 2.3* 2.6*  PROT 8.0 6.9  ALBUMIN 4.9 4.3   No results for input(s): LIPASE, AMYLASE in the last 168 hours. No results for input(s): AMMONIA in the last 168 hours. CBC: Recent Labs  Lab 08/18/17 1506 08/19/17 0428  WBC 10.6* 13.6*  NEUTROABS 6.1  --   HGB 9.5* 8.7*  HCT 28.7* 26.3*  MCV 88.3 88.3  PLT 466* 433*   Cardiac Enzymes: No results for input(s): CKTOTAL, CKMB, CKMBINDEX, TROPONINI in the last 168 hours. BNP: Invalid input(s):  POCBNP CBG: No results for input(s): GLUCAP in the last 168 hours.  Time coordinating discharge: 50 minutes  Signed:  Donia Pounds  MSN, FNP-C [facility] [address] [place], [place] [address] [phone]  [facility] 08/20/2017, 11:24 AM

## 2017-08-20 NOTE — Discharge Instructions (Signed)
You have an appointment with Wendy Boast, FNP at [facility] to establish care.   Discussed the importance of drinking 64 ounces of water daily. The Importance of Water. To help prevent pain crises, it is important to drink plenty of water throughout the day. This is because dehydration of red blood cells may lead to the sickling process.      Sickle Cell Anemia, Adult Sickle cell anemia is a condition where your red blood cells are shaped like sickles. Red blood cells carry oxygen through the body. Sickle-shaped red blood cells do not live as long as normal red blood cells. They also clump together and block blood from flowing through the blood vessels. These things prevent the body from getting enough oxygen. Sickle cell anemia causes organ damage and pain. It also increases the risk of infection. Follow these instructions at home:  Drink enough fluid to keep your pee (urine) clear or pale yellow. Drink more in hot weather and during exercise.  Do not smoke. Smoking lowers oxygen levels in the blood.  Only take over-the-counter or prescription medicines as told by your doctor.  Take antibiotic medicines as told by your doctor. Make sure you finish them even if you start to feel better.  Take supplements as told by your doctor.  Consider wearing a medical alert bracelet. This tells anyone caring for you in an emergency of your condition.  When traveling, keep your medical information, doctors' names, and the medicines you take with you at all times.  If you have a fever, do not take fever medicines right away. This could cover up a problem. Tell your doctor.  Keep all follow-up visits with your doctor. Sickle cell anemia requires regular medical care. Contact a doctor if: You have a fever. Get help right away if:  You feel dizzy or faint.  You have new belly (abdominal) pain, especially on the left side near the stomach area.  You have a lasting, often uncomfortable and  painful erection of the penis (priapism). If it is not treated right away, you will become unable to have sex (impotence).  You have numbness in your arms or legs or you have a hard time moving them.  You have a hard time talking.  You have a fever or lasting symptoms for more than 2-3 days.  You have a fever and your symptoms suddenly get worse.  You have signs or symptoms of infection. These include: ? Chills. ? Being more tired than normal (lethargy). ? Irritability. ? Poor eating. ? Throwing up (vomiting).  You have pain that is not helped with medicine.  You have shortness of breath.  You have pain in your chest.  You are coughing up pus-like or bloody mucus.  You have a stiff neck.  Your feet or hands swell or have pain.  Your belly looks bloated.  Your joints hurt. This information is not intended to replace advice given to you by your health care provider. Make sure you discuss any questions you have with your health care provider. Document Released: 11/10/2012 Document Revised: 06/28/2015 Document Reviewed: 09/01/2012 Elsevier Interactive Patient Education  2017 [employer].

## 2017-08-20 NOTE — Progress Notes (Signed)
PHARMACIST - PHYSICIAN COMMUNICATION  DR:   Doreene Burke  CONCERNING: IV to Oral Route Change Policy  RECOMMENDATION: This patient is receiving Folic acid, Benadryl by the intravenous route.  Based on criteria approved by the Pharmacy and Therapeutics Committee, the intravenous medication(s) is/are being converted to the equivalent oral dose form(s).  DESCRIPTION: These criteria include:  The patient is eating (either orally or via tube) and/or has been taking other orally administered medications for a least 24 hours  The patient has no evidence of active gastrointestinal bleeding or impaired GI absorption (gastrectomy, short bowel, patient on TNA or NPO).  If you have questions about this conversion, please contact the Pharmacy Department  [phone] )  [place] PharmD, [place] Pager [phone] 08/20/2017 12:47 PM

## 2017-08-20 NOTE — Progress Notes (Signed)
Discharge instructions reviewed with patient. Patient verbalized understanding with no concerns voiced.

## 2017-08-27 ENCOUNTER — Emergency Department (HOSPITAL_COMMUNITY)
Admission: EM | Admit: 2017-08-27 | Discharge: 2017-08-27 | Disposition: A | Discharge status: Home / Self Care | Payer: Medicare Other | Attending: Emergency Medicine | Admitting: Emergency Medicine

## 2017-08-27 ENCOUNTER — Emergency Department (HOSPITAL_COMMUNITY): Payer: Medicare Other

## 2017-08-27 ENCOUNTER — Other Ambulatory Visit: Payer: Self-pay

## 2017-08-27 DIAGNOSIS — D5700 Hb-SS disease with crisis, unspecified: Secondary | ICD-10-CM | POA: Diagnosis not present

## 2017-08-27 DIAGNOSIS — M79605 Pain in left leg: Secondary | ICD-10-CM | POA: Diagnosis present

## 2017-08-27 LAB — I-STAT BETA HCG BLOOD, ED (MC, WL, AP ONLY)

## 2017-08-27 LAB — COMPREHENSIVE METABOLIC PANEL
ALK PHOS: 33 U/L — AB (ref 38–126)
ALT: 17 U/L (ref 0–44)
AST: 34 U/L (ref 15–41)
Albumin: 5.3 g/dL — ABNORMAL HIGH (ref 3.5–5.0)
Anion gap: 10 (ref 5–15)
BILIRUBIN TOTAL: 2.4 mg/dL — AB (ref 0.3–1.2)
BUN: 8 mg/dL (ref 6–20)
CALCIUM: 9.4 mg/dL (ref 8.9–10.3)
CO2: 22 mmol/L (ref 22–32)
CREATININE: 0.67 mg/dL (ref 0.44–1.00)
Chloride: 107 mmol/L (ref 98–111)
Glucose, Bld: 86 mg/dL (ref 70–99)
Potassium: 3.9 mmol/L (ref 3.5–5.1)
Sodium: 139 mmol/L (ref 135–145)
TOTAL PROTEIN: 8.6 g/dL — AB (ref 6.5–8.1)

## 2017-08-27 LAB — CBC WITH DIFFERENTIAL/PLATELET
BASOS PCT: 1 %
Basophils Absolute: 0.1 10*3/uL (ref 0.0–0.1)
EOS PCT: 2 %
Eosinophils Absolute: 0.2 10*3/uL (ref 0.0–0.7)
HCT: 30.3 % — ABNORMAL LOW (ref 36.0–46.0)
HEMOGLOBIN: 10.2 g/dL — AB (ref 12.0–15.0)
Lymphocytes Relative: 27 %
Lymphs Abs: 3.3 10*3/uL (ref 0.7–4.0)
MCH: 29.1 pg (ref 26.0–34.0)
MCHC: 33.7 g/dL (ref 30.0–36.0)
MCV: 86.3 fL (ref 78.0–100.0)
Monocytes Absolute: 0.9 10*3/uL (ref 0.1–1.0)
Monocytes Relative: 7 %
NEUTROS PCT: 63 %
Neutro Abs: 8 10*3/uL — ABNORMAL HIGH (ref 1.7–7.7)
PLATELETS: 432 10*3/uL — AB (ref 150–400)
RBC: 3.51 MIL/uL — AB (ref 3.87–5.11)
RDW: 18.3 % — ABNORMAL HIGH (ref 11.5–15.5)
WBC: 12.4 10*3/uL — AB (ref 4.0–10.5)

## 2017-08-27 LAB — RETICULOCYTES
RBC.: 3.51 MIL/uL — AB (ref 3.87–5.11)
RETIC CT PCT: 10.4 % — AB (ref 0.4–3.1)
Retic Count, Absolute: 365 10*3/uL — ABNORMAL HIGH (ref 19.0–186.0)

## 2017-08-27 MED ORDER — HYDROMORPHONE HCL 2 MG/ML IJ SOLN: 2.0000 mg | INTRAMUSCULAR | Status: AC

## 2017-08-27 MED ORDER — HYDROMORPHONE HCL 2 MG/ML IJ SOLN
2.0000 mg | INTRAMUSCULAR | Status: AC
  Administered 2017-08-27: 2 mg via INTRAVENOUS
  Filled 2017-08-27: qty 1

## 2017-08-27 MED ORDER — ETODOLAC 300 MG PO CAPS: 300.0000 mg | ORAL_CAPSULE | Freq: Three times a day (TID) | ORAL | 0 refills | Status: AC

## 2017-08-27 MED ORDER — HYDROMORPHONE HCL 2 MG/ML IJ SOLN
2.0000 mg | Freq: Once | INTRAMUSCULAR | Status: AC
  Administered 2017-08-27: 2 mg via INTRAVENOUS
  Filled 2017-08-27: qty 1

## 2017-08-27 MED ORDER — ONDANSETRON HCL 4 MG/2ML IJ SOLN
4.0000 mg | INTRAMUSCULAR | Status: DC | PRN
  Administered 2017-08-27: 4 mg via INTRAVENOUS
  Filled 2017-08-27: qty 2

## 2017-08-27 MED ORDER — SODIUM CHLORIDE 0.45 % IV SOLN
INTRAVENOUS | Status: DC
  Administered 2017-08-27: 17:00:00 via INTRAVENOUS

## 2017-08-27 MED ORDER — KETOROLAC TROMETHAMINE 30 MG/ML IJ SOLN
30.0000 mg | INTRAMUSCULAR | Status: AC
  Administered 2017-08-27: 30 mg via INTRAVENOUS
  Filled 2017-08-27: qty 1

## 2017-08-27 MED ORDER — DIPHENHYDRAMINE HCL 25 MG PO CAPS
25.0000 mg | ORAL_CAPSULE | ORAL | Status: DC | PRN
  Administered 2017-08-27: 50 mg via ORAL
  Filled 2017-08-27: qty 2

## 2017-08-27 NOTE — Discharge Instructions (Signed)
Continue your current medications, follow-up with the sickle cell  center

## 2017-08-27 NOTE — ED Provider Notes (Addendum)
[facility] DEPT Provider Note   CSN: [id number] Arrival date & time: 08/27/17  1510     History   Chief Complaint Chief Complaint  Patient presents with  . Sickle Cell Pain Crisis    HPI Wendy [name] is a 27 y.o. female.  The history is provided by the patient.  Sickle Cell Pain Crisis  Location:  Lower extremity Severity:  Severe Onset quality:  Gradual Duration:  1 day Similar to previous crisis episodes: yes   Timing:  Constant Progression:  Worsening Chronicity:  Recurrent Frequency of attacks:  Pt was recently admitted to the hospital and discharged earlier this week. Context: not dehydration, not infection and not menses   Relieved by:  Nothing Worsened by:  Nothing Ineffective treatments:  None tried Associated symptoms: no chest pain and no shortness of breath   Risk factors: frequent admissions for pain   Pt states she does not have a sickle cell doctor in this area.   She does not have any medications that she is prescribed.  Recently admitted on 7/16 and discharged on 7/18.  She is supposed to follow up in the sickle cell clinic on 7/29.  Discharge summaries indicate she was prescribed oxycodone.  Past Medical History:  Diagnosis Date  . Acute chest syndrome ([facility]) 07/22/2017  . Anemia   . Sickle cell anemia ([facility])   . Tobacco abuse     Patient Active Problem List   Diagnosis Date Noted  . Acute chest syndrome ([facility]) 07/22/2017  . Sickle cell anemia with pain ([facility]) 05/12/2017  . Sickle cell pain crisis ([facility]) 05/27/2016  . Leukocytosis 05/27/2016  . Sickle cell anemia ([facility])     Past Surgical History:  Procedure Laterality Date  . CESAREAN SECTION    . CESAREAN SECTION WITH BILATERAL TUBAL LIGATION       OB History   None      Home Medications    Prior to Admission medications   Medication Sig Start Date End Date Taking? Authorizing Provider  folic acid (FOLVITE) 1 MG tablet Take 1 tablet (1 mg total) by  mouth daily. 08/20/17 08/20/18 Yes Dorena Dew, FNP  oxyCODONE-acetaminophen (PERCOCET/ROXICET) 5-325 MG tablet Take 1 tablet by mouth every 6 (six) hours as needed for severe pain. 08/20/17  Yes Dorena Dew, FNP  etodolac (LODINE) 300 MG capsule Take 1 capsule (300 mg total) by mouth every 8 (eight) hours. 08/27/17   Dorie Rank, MD    Family History Family History  Problem Relation Age of Onset  . Sickle cell trait Father     Social History Social History   Tobacco Use  . Smoking status: Never Smoker  . Smokeless tobacco: Never Used  Substance Use Topics  . Alcohol use: No  . Drug use: No     Allergies   Patient has no known allergies.   Review of Systems Review of Systems  Respiratory: Negative for shortness of breath.   Cardiovascular: Negative for chest pain.  All other systems reviewed and are negative.    Physical Exam Updated Vital Signs BP 123/79   Pulse (!) 112   Temp 99.8 F (37.7 C) (Oral)   Resp 17   LMP 08/24/2017   SpO2 97%   Physical Exam  Constitutional: She appears well-developed and well-nourished. No distress.  HENT:  Head: Normocephalic and atraumatic.  Right Ear: External ear normal.  Left Ear: External ear normal.  Eyes: Conjunctivae are normal. Right eye exhibits no discharge. Left  eye exhibits no discharge. No scleral icterus.  Neck: Neck supple. No tracheal deviation present.  Cardiovascular: Normal rate, regular rhythm and intact distal pulses.  Pulmonary/Chest: Effort normal and breath sounds normal. No stridor. No respiratory distress. She has no wheezes. She has no rales.  Abdominal: Soft. Bowel sounds are normal. She exhibits no distension. There is no tenderness. There is no rebound and no guarding.  Musculoskeletal: She exhibits no edema or tenderness.  Neurological: She is alert. She has normal strength. No cranial nerve deficit (no facial droop, extraocular movements intact, no slurred speech) or sensory deficit. She  exhibits normal muscle tone. She displays no seizure activity. Coordination normal.  Skin: Skin is warm and dry. No rash noted.  Psychiatric: She has a normal mood and affect.  Nursing note and vitals reviewed.    ED Treatments / Results  Labs (all labs ordered are listed, but only abnormal results are displayed) Labs Reviewed  COMPREHENSIVE METABOLIC PANEL - Abnormal; Notable for the following components:      Result Value   Total Protein 8.6 (*)    Albumin 5.3 (*)    Alkaline Phosphatase 33 (*)    Total Bilirubin 2.4 (*)    All other components within normal limits  CBC WITH DIFFERENTIAL/PLATELET - Abnormal; Notable for the following components:   WBC 12.4 (*)    RBC 3.51 (*)    Hemoglobin 10.2 (*)    HCT 30.3 (*)    RDW 18.3 (*)    Platelets 432 (*)    Neutro Abs 8.0 (*)    All other components within normal limits  RETICULOCYTES - Abnormal; Notable for the following components:   Retic Ct Pct 10.4 (*)    RBC. 3.51 (*)    Retic Count, Absolute 365.0 (*)    All other components within normal limits  I-STAT BETA HCG BLOOD, ED ([facility], [facility], [facility] ONLY)    EKG None  Radiology Dg Chest 2 View  Result Date: 08/27/2017 CLINICAL DATA:  Acute sickle cell pain concentrated in chest - COPD - CHF - diabetic - asthma EXAM: CHEST - 2 VIEW COMPARISON:  08/04/2017 FINDINGS: Lungs are clear. Heart size and mediastinal contours are within normal limits. No effusion.  No pneumothorax. Visualized bones unremarkable.  Cholecystectomy clips. IMPRESSION: No acute cardiopulmonary disease. Electronically Signed   By: Lucrezia Europe M.D.   On: 08/27/2017 15:58    Procedures .Critical Care Performed by: Dorie Rank, MD Authorized by: Dorie Rank, MD   Critical care provider statement:    Critical care time (minutes):  30   Critical care was time spent personally by me on the following activities:  Discussions with consultants, evaluation of patient's response to treatment, examination of patient,  ordering and performing treatments and interventions, ordering and review of laboratory studies, ordering and review of radiographic studies, pulse oximetry, re-evaluation of patient's condition, obtaining history from patient or surrogate and review of old charts   (including critical care time)  Medications Ordered in ED Medications  0.45 % sodium chloride infusion ( Intravenous New Bag/Given 08/27/17 1648)  diphenhydrAMINE (BENADRYL) capsule 25-50 mg (50 mg Oral Given 08/27/17 1649)  ondansetron (ZOFRAN) injection 4 mg (4 mg Intravenous Given 08/27/17 1649)  ketorolac (TORADOL) 30 MG/ML injection 30 mg (30 mg Intravenous Given 08/27/17 1649)  HYDROmorphone (DILAUDID) injection 2 mg (2 mg Intravenous Given 08/27/17 1649)    Or  HYDROmorphone (DILAUDID) injection 2 mg ( Subcutaneous See Alternative 08/27/17 1649)  HYDROmorphone (DILAUDID) injection 2 mg (2  mg Intravenous Given 08/27/17 1745)    Or  HYDROmorphone (DILAUDID) injection 2 mg ( Subcutaneous See Alternative 08/27/17 1745)  HYDROmorphone (DILAUDID) injection 2 mg (2 mg Intravenous Given 08/27/17 1857)    Or  HYDROmorphone (DILAUDID) injection 2 mg ( Subcutaneous See Alternative 08/27/17 1857)  HYDROmorphone (DILAUDID) injection 2 mg (2 mg Intravenous Given 08/27/17 2049)     Initial Impression / Assessment and Plan / ED Course  I have reviewed the triage vital signs and the nursing notes.  Pertinent labs & imaging results that were available during my care of the patient were reviewed by me and considered in my medical decision making (see chart for details).  Clinical Course as of Aug 27 2133  Thu Aug 27, 2017  1748 Hemoglobin is stable  CBC with Differential(!) [JK]  1749 Elevated  Reticulocytes(!) [JK]  1749 Chronic hyperbilirubinemia  Comprehensive metabolic panel(!) [JK]  3428 Pt states she is feeling better but pain is not resolved.  She would like to try one more dose of medications and then try to go home   [JK]  2133 15  day oxyycodone supply on 7/18   [JK]    Clinical Course User Index [JK] Dorie Rank, MD    Patient presented to the emergency room for evaluation of sickle cell pain crisis.  Patient has a history of sickle cell disease.  She recently moved to this area and has not established care yet although when she was last in the hospital she was instructed to follow-up with sickle cell center.  Patient was treated with IV pain medication she is had some improvement in her pain although not completely resolved.  Patient appears more comfortable.  She is tolerating p.o.  Think it is reasonable for discharge home and try to follow-up with physical cell center tomorrow.  She should continue her oxycodone.  Also given prescription for NSAIDs.  Final Clinical Impressions(s) / ED Diagnoses   Final diagnoses:  Sickle cell pain crisis [facility])    ED Discharge Orders        Ordered    etodolac (LODINE) 300 MG capsule  Every 8 hours    Note to Pharmacy:  As needed for pain   08/27/17 2133       Dorie Rank, MD 08/27/17 2135 Rich Brave, MD 09/08/17 [phone]

## 2017-08-27 NOTE — ED Notes (Signed)
Pt calling out to nurses station multiple times requesting something for itching. Pt already informed that MD is unable to give more Benadryl at this time. MD made aware of pts mutiple callouts. MD to pt bedside to inform her that he is unable to give more Benadryl at this time. Pt states " can I get some lotion or something then?" Pt provided with Mositurizing body cream from supply room

## 2017-08-27 NOTE — ED Notes (Signed)
Pt requesting IV benadryl for itching. MD made aware of pt request. Pt received 50mg  PO benadryl at 1650. Per MD: Pt unable to get more benadryl at this time

## 2017-08-27 NOTE — ED Notes (Signed)
Pt provided with coke and crackers.

## 2017-08-27 NOTE — ED Notes (Signed)
Attempted IV in right AC. Unsuccessful, Jake,RN notified to insert with ultrasound.

## 2017-08-27 NOTE — ED Notes (Signed)
EKG given to Dr. Zammit 

## 2017-08-27 NOTE — ED Triage Notes (Signed)
Patient BIB [facility] from home. Pt c/o pain in legs and chest 8/10 started today and has gotten gradually worse. Pt was recently admitted and discharged Monday. Pt a/o x4, denies SOB.

## 2017-08-27 NOTE — ED Notes (Signed)
Pt returned from MRI at this time

## 2017-08-31 ENCOUNTER — Encounter (HOSPITAL_COMMUNITY): Payer: Self-pay | Admitting: Emergency Medicine

## 2017-08-31 ENCOUNTER — Ambulatory Visit: Payer: Medicare Other | Admitting: Family Medicine

## 2017-08-31 ENCOUNTER — Emergency Department (HOSPITAL_COMMUNITY)
Admission: EM | Admit: 2017-08-31 | Discharge: 2017-08-31 | Disposition: A | Discharge status: Home / Self Care | Payer: Medicare Other | Attending: Emergency Medicine | Admitting: Emergency Medicine

## 2017-08-31 ENCOUNTER — Inpatient Hospital Stay: Payer: Medicare Other | Admitting: Student in an Organized Health Care Education/Training Program

## 2017-08-31 DIAGNOSIS — D5700 Hb-SS disease with crisis, unspecified: Secondary | ICD-10-CM | POA: Diagnosis not present

## 2017-08-31 DIAGNOSIS — M79604 Pain in right leg: Secondary | ICD-10-CM | POA: Diagnosis present

## 2017-08-31 LAB — COMPREHENSIVE METABOLIC PANEL
ALK PHOS: 25 U/L — AB (ref 38–126)
ALT: 11 U/L (ref 0–44)
ANION GAP: 7 (ref 5–15)
AST: 20 U/L (ref 15–41)
Albumin: 4.1 g/dL (ref 3.5–5.0)
BILIRUBIN TOTAL: 2.5 mg/dL — AB (ref 0.3–1.2)
BUN: 7 mg/dL (ref 6–20)
CALCIUM: 7.9 mg/dL — AB (ref 8.9–10.3)
CO2: 21 mmol/L — ABNORMAL LOW (ref 22–32)
Chloride: 114 mmol/L — ABNORMAL HIGH (ref 98–111)
Creatinine, Ser: 0.37 mg/dL — ABNORMAL LOW (ref 0.44–1.00)
Glucose, Bld: 84 mg/dL (ref 70–99)
Potassium: 2.9 mmol/L — ABNORMAL LOW (ref 3.5–5.1)
Sodium: 142 mmol/L (ref 135–145)
Total Protein: 6.6 g/dL (ref 6.5–8.1)

## 2017-08-31 LAB — CBC WITH DIFFERENTIAL/PLATELET
Basophils Absolute: 0 10*3/uL (ref 0.0–0.1)
Basophils Relative: 0 %
Eosinophils Absolute: 0.2 10*3/uL (ref 0.0–0.7)
Eosinophils Relative: 2 %
HEMATOCRIT: 26.5 % — AB (ref 36.0–46.0)
HEMOGLOBIN: 9.1 g/dL — AB (ref 12.0–15.0)
LYMPHS ABS: 3.5 10*3/uL (ref 0.7–4.0)
Lymphocytes Relative: 33 %
MCH: 29.4 pg (ref 26.0–34.0)
MCHC: 34.3 g/dL (ref 30.0–36.0)
MCV: 85.5 fL (ref 78.0–100.0)
MONO ABS: 0.7 10*3/uL (ref 0.1–1.0)
Monocytes Relative: 7 %
NEUTROS ABS: 6.2 10*3/uL (ref 1.7–7.7)
NEUTROS PCT: 58 %
Platelets: 434 10*3/uL — ABNORMAL HIGH (ref 150–400)
RBC: 3.1 MIL/uL — ABNORMAL LOW (ref 3.87–5.11)
RDW: 18.6 % — AB (ref 11.5–15.5)
WBC: 10.7 10*3/uL — ABNORMAL HIGH (ref 4.0–10.5)

## 2017-08-31 LAB — RETICULOCYTES
RBC.: 3.1 MIL/uL — ABNORMAL LOW (ref 3.87–5.11)
Retic Count, Absolute: 337.9 10*3/uL — ABNORMAL HIGH (ref 19.0–186.0)
Retic Ct Pct: 10.9 % — ABNORMAL HIGH (ref 0.4–3.1)

## 2017-08-31 LAB — I-STAT BETA HCG BLOOD, ED (MC, WL, AP ONLY): I-stat hCG, quantitative: <5 m[IU]/mL (ref <5)

## 2017-08-31 MED ORDER — HYDROMORPHONE HCL 1 MG/ML IJ SOLN
0.5000 mg | INTRAMUSCULAR | Status: AC
  Administered 2017-08-31: 0.5 mg via INTRAVENOUS
  Filled 2017-08-31: qty 1

## 2017-08-31 MED ORDER — HYDROMORPHONE HCL 1 MG/ML IJ SOLN: 1.0000 mg | INTRAMUSCULAR | Status: DC

## 2017-08-31 MED ORDER — POTASSIUM CHLORIDE 10 MEQ/100ML IV SOLN
10.0000 meq | INTRAVENOUS | Status: DC
  Administered 2017-08-31: 10 meq via INTRAVENOUS
  Filled 2017-08-31 (×2): qty 100

## 2017-08-31 MED ORDER — HYDROMORPHONE HCL 1 MG/ML IJ SOLN: 0.5000 mg | INTRAMUSCULAR | Status: AC

## 2017-08-31 MED ORDER — ONDANSETRON HCL 4 MG/2ML IJ SOLN
4.0000 mg | INTRAMUSCULAR | Status: DC | PRN
  Administered 2017-08-31: 4 mg via INTRAVENOUS
  Filled 2017-08-31: qty 2

## 2017-08-31 MED ORDER — KETOROLAC TROMETHAMINE 30 MG/ML IJ SOLN
30.0000 mg | INTRAMUSCULAR | Status: AC
  Administered 2017-08-31: 30 mg via INTRAVENOUS
  Filled 2017-08-31: qty 1

## 2017-08-31 MED ORDER — DIPHENHYDRAMINE HCL 25 MG PO CAPS: 25.0000 mg | ORAL_CAPSULE | ORAL | Status: DC | PRN

## 2017-08-31 MED ORDER — HYDROMORPHONE HCL 1 MG/ML IJ SOLN
0.5000 mg | Freq: Once | INTRAMUSCULAR | Status: AC
  Administered 2017-08-31: 0.5 mg via INTRAVENOUS
  Filled 2017-08-31: qty 1

## 2017-08-31 NOTE — ED Triage Notes (Signed)
Patient c/o bilateral leg, back and arm pain r/t to [facility] since last night.

## 2017-08-31 NOTE — ED Provider Notes (Signed)
[facility] DEPT Provider Note   CSN: [id number] Arrival date & time: 08/31/17  1253     History   Chief Complaint Chief Complaint  Patient presents with  . Sickle Cell Pain Crisis    HPI Wendy [name] is a 27 y.o. female.  Patient with history of sickle cell anemia, recent baseline hemoglobin in the 7-9 range, history of blood transfusion, currently taking Percocet as needed, not on any daily medications --presents with complaint of worsening pain related to sickle cell anemia.  Patient describes pain in her extremities, worse in right leg.  She denies any chest pain, fever, shortness of breath.  She denies any joint swelling.  No falls or injuries.  Patient states that she took 1 of her home Percocets last night and again today without sufficient improvement.  She presents for exacerbation of sickle cell pain crisis. The onset of this condition was acute. The course is worsening. Aggravating factors: none. Alleviating factors: none.       Past Medical History:  Diagnosis Date  . Acute chest syndrome ([place]) 07/22/2017  . Anemia   . Sickle cell anemia (HCC)   . Tobacco abuse     Patient Active Problem List   Diagnosis Date Noted  . Acute chest syndrome ([place]) 07/22/2017  . Sickle cell anemia with pain ([place]) 05/12/2017  . Sickle cell pain crisis ([place]) 05/27/2016  . Leukocytosis 05/27/2016  . Sickle cell anemia (HCC)     Past Surgical History:  Procedure Laterality Date  . CESAREAN SECTION    . CESAREAN SECTION WITH BILATERAL TUBAL LIGATION       OB History   None      Home Medications    Prior to Admission medications   Medication Sig Start Date End Date Taking? Authorizing Provider  etodolac (LODINE) 300 MG capsule Take 1 capsule (300 mg total) by mouth every 8 (eight) hours. 08/27/17  Yes Dorie Rank, MD  folic acid (FOLVITE) 1 MG tablet Take 1 tablet (1 mg total) by mouth daily. 08/20/17 08/20/18 Yes Dorena Dew, FNP    oxyCODONE-acetaminophen (PERCOCET/ROXICET) 5-325 MG tablet Take 1 tablet by mouth every 6 (six) hours as needed for severe pain. 08/20/17  Yes Dorena Dew, FNP    Family History Family History  Problem Relation Age of Onset  . Sickle cell trait Father     Social History Social History   Tobacco Use  . Smoking status: Never Smoker  . Smokeless tobacco: Never Used  Substance Use Topics  . Alcohol use: No  . Drug use: No     Allergies   Patient has no known allergies.   Review of Systems Review of Systems  Constitutional: Negative for fever.  HENT: Negative for rhinorrhea and sore throat.   Eyes: Negative for redness.  Respiratory: Negative for cough.   Cardiovascular: Negative for chest pain.  Gastrointestinal: Negative for abdominal pain, diarrhea, nausea and vomiting.  Genitourinary: Negative for dysuria.  Musculoskeletal: Positive for arthralgias and myalgias.  Skin: Negative for rash.  Neurological: Negative for headaches.     Physical Exam Updated Vital Signs BP (!) 119/94 (BP Location: Right Arm)   Pulse 91   Temp 99 F (37.2 C) (Oral)   Resp 18   LMP 08/24/2017   SpO2 97%   Physical Exam  Constitutional: She appears well-developed and well-nourished.  HENT:  Head: Normocephalic and atraumatic.  Mouth/Throat: Oropharynx is clear and moist.  Eyes: Conjunctivae are normal. Right eye exhibits no  discharge. Left eye exhibits no discharge.  Neck: Normal range of motion. Neck supple.  Cardiovascular: Normal rate, regular rhythm and normal heart sounds.  Pulses:      Radial pulses are 2+ on the right side, and 2+ on the left side.  Pulmonary/Chest: Effort normal and breath sounds normal.  Abdominal: Soft. There is no tenderness.  Musculoskeletal: She exhibits tenderness.  No focal areas of tenderness on the extremities.  Patient with full range of motion of the upper and lower extremities.  Neurological: She is alert.  Skin: Skin is warm and dry.   Psychiatric: She has a normal mood and affect.  Nursing note and vitals reviewed.    ED Treatments / Results  Labs (all labs ordered are listed, but only abnormal results are displayed) Labs Reviewed  COMPREHENSIVE METABOLIC PANEL  CBC WITH DIFFERENTIAL/PLATELET  RETICULOCYTES  I-STAT BETA HCG BLOOD, ED ([facility], [facility], [facility] ONLY)    EKG None  Radiology No results found.  Procedures Procedures (including critical care time)  Medications Ordered in ED Medications  ketorolac (TORADOL) 30 MG/ML injection 30 mg (has no administration in time range)  HYDROmorphone (DILAUDID) injection 0.5 mg (has no administration in time range)    Or  HYDROmorphone (DILAUDID) injection 0.5 mg (has no administration in time range)  HYDROmorphone (DILAUDID) injection 1 mg (has no administration in time range)    Or  HYDROmorphone (DILAUDID) injection 1 mg (has no administration in time range)  HYDROmorphone (DILAUDID) injection 1 mg (has no administration in time range)    Or  HYDROmorphone (DILAUDID) injection 1 mg (has no administration in time range)  HYDROmorphone (DILAUDID) injection 1 mg (has no administration in time range)    Or  HYDROmorphone (DILAUDID) injection 1 mg (has no administration in time range)  diphenhydrAMINE (BENADRYL) capsule 25-50 mg (has no administration in time range)  ondansetron (ZOFRAN) injection 4 mg (has no administration in time range)     Initial Impression / Assessment and Plan / ED Course  I have reviewed the triage vital signs and the nursing notes.  Pertinent labs & imaging results that were available during my care of the patient were reviewed by me and considered in my medical decision making (see chart for details).     Patient seen and examined. Work-up initiated. Medications ordered.   Vital signs reviewed and are as follows: BP (!) 119/94 (BP Location: Right Arm)   Pulse 91   Temp 99 F (37.2 C) (Oral)   Resp 18   LMP 08/24/2017   SpO2 97%    3:50 PM RN unable to start IV. IV team consult ordered. Pt frustrated regarding wait.   Handoff to [facility] at shift change.   Pt may leave if desired, however, orders placed for pain control and IV narcotics. No concern for acute chest at this time.   Final Clinical Impressions(s) / ED Diagnoses   Final diagnoses:  Vasoocclusive sickle cell crisis ([place])   Pending treatment and work-up.   ED Discharge Orders    None       Carlisle Cater, Hershal Coria 08/31/17 1552    Long, Wonda Olds, MD 08/31/17 [phone]

## 2017-08-31 NOTE — ED Provider Notes (Signed)
  Physical Exam  BP (!) 119/94 (BP Location: Right Arm)   Pulse 91   Temp 99 F (37.2 C) (Oral)   Resp 18   LMP 08/24/2017   SpO2 97%   Physical Exam  Constitutional: She appears well-developed and well-nourished. No distress.  HENT:  Head: Normocephalic and atraumatic.  Eyes: Conjunctivae and EOM are normal. No scleral icterus.  Neck: Normal range of motion.  Pulmonary/Chest: Effort normal. No respiratory distress.  Neurological: She is alert.  Skin: No rash noted. She is not diaphoretic.  Psychiatric: She has a normal mood and affect.  Nursing note and vitals reviewed.   ED Course/Procedures     Procedures  MDM  Care handed off from previous provider, Alvera Singh, PA-C.  Please see their note for further detail.  Briefly, with a history of sickle cell anemia, baseline hemoglobin in the 7-9 range, presents to ED for pain related to sickle cell crisis.  Describes the pain in extremities, worse in right leg.  States that this is typical of her sickle cell crises.  Denies any chest pain, fever, shortness of breath, joint swelling, injuries or falls.  No improvement with 1 dose of her home Percocet which she takes as needed.  Patient is afebrile, vital signs are reassuring.  Plan is to obtain lab work, give pain medicine and reassess.  Lab work significant for hemoglobin of 9.1 which is at her baseline.  CMP shows potassium of 2.9.  Will replete potassium and reassess after pain medication given.  Patient given 10 meq of potassium and 0.5 of Dilaudid.  She does have her 8-year-old child in the room with her as she states that she has no one for else for childcare.  I did not feel comfortable giving patient more IV pain medicine with the child present with no one to watch him.  Patient states that her pain has improved with another 0.5 of Dilaudid that was given.  Patient states that she does not want to receive any more IV potassium and would like to go home as her pain is now controlled.   Vital signs remained stable.  Advised to return to ED for any severe worsening symptoms.   Portions of this note were generated with [employer]. Dictation errors may occur despite best attempts at proofreading.    Wendy Heady, PA-C 08/31/17 2340    Drenda Freeze, MD 08/31/17 [phone]

## 2017-08-31 NOTE — Progress Notes (Deleted)
  HPI:  Wendy [name] presents for hospital follow up. Patient was hospitalized from 08/18/2017 to 08/20/2017 with a sickle cell pain crisis.  Pain crisis was managed in hospital with IVF< IV Dilaudid via PCA and IV toradol. DC summary recommends rechecking labs at today's visit.  Hgb 10.2 on 7/25. Reticulocytes appropriately elevated at 365.   Patient now reports ***  ROS: See HPI.  [place]: ***  PHYSICAL EXAM: LMP 08/24/2017  Gen: *** HEENT: *** Heart: *** Lungs: *** Neuro: *** Ext: ***  ASSESSMENT/PLAN:  No problem-specific Assessment & Plan notes found for this encounter.  FOLLOW UP: Follow up in *** for ***  Everrett Coombe, MD, [place]

## 2017-08-31 NOTE — Discharge Instructions (Signed)
Continue your home medications as previously prescribed. Return to ED for worsening symptoms, fever, chest pain, shortness of breath, swelling in legs, coughing up blood.

## 2017-09-03 ENCOUNTER — Other Ambulatory Visit: Payer: Self-pay

## 2017-09-03 ENCOUNTER — Emergency Department (HOSPITAL_COMMUNITY)
Admission: EM | Admit: 2017-09-03 | Discharge: 2017-09-03 | Disposition: A | Discharge status: Home / Self Care | Payer: Medicare Other | Attending: Emergency Medicine | Admitting: Emergency Medicine

## 2017-09-03 ENCOUNTER — Encounter (HOSPITAL_COMMUNITY): Payer: Self-pay

## 2017-09-03 DIAGNOSIS — Z79899 Other long term (current) drug therapy: Secondary | ICD-10-CM | POA: Insufficient documentation

## 2017-09-03 DIAGNOSIS — D5700 Hb-SS disease with crisis, unspecified: Secondary | ICD-10-CM

## 2017-09-03 DIAGNOSIS — M545 Low back pain: Secondary | ICD-10-CM | POA: Diagnosis present

## 2017-09-03 LAB — RETICULOCYTES
RBC.: 3.25 MIL/uL — ABNORMAL LOW (ref 3.87–5.11)
RETIC CT PCT: 5.5 % — AB (ref 0.4–3.1)
Retic Count, Absolute: 178.8 10*3/uL (ref 19.0–186.0)

## 2017-09-03 LAB — CBC WITH DIFFERENTIAL/PLATELET
Basophils Absolute: 0.1 10*3/uL (ref 0.0–0.1)
Basophils Relative: 1 %
EOS ABS: 0.4 10*3/uL (ref 0.0–0.7)
EOS PCT: 4 %
HCT: 27.9 % — ABNORMAL LOW (ref 36.0–46.0)
Hemoglobin: 9.4 g/dL — ABNORMAL LOW (ref 12.0–15.0)
LYMPHS PCT: 33 %
Lymphs Abs: 3 10*3/uL (ref 0.7–4.0)
MCH: 28.9 pg (ref 26.0–34.0)
MCHC: 33.7 g/dL (ref 30.0–36.0)
MCV: 85.8 fL (ref 78.0–100.0)
MONOS PCT: 8 %
Monocytes Absolute: 0.7 10*3/uL (ref 0.1–1.0)
Neutro Abs: 5 10*3/uL (ref 1.7–7.7)
Neutrophils Relative %: 54 %
PLATELETS: 434 10*3/uL — AB (ref 150–400)
RBC: 3.25 MIL/uL — ABNORMAL LOW (ref 3.87–5.11)
RDW: 17.1 % — ABNORMAL HIGH (ref 11.5–15.5)
WBC: 9.1 10*3/uL (ref 4.0–10.5)

## 2017-09-03 LAB — COMPREHENSIVE METABOLIC PANEL
ALBUMIN: 5.1 g/dL — AB (ref 3.5–5.0)
ALT: 12 U/L (ref 0–44)
AST: 29 U/L (ref 15–41)
Alkaline Phosphatase: 32 U/L — ABNORMAL LOW (ref 38–126)
Anion gap: 9 (ref 5–15)
BUN: 5 mg/dL — AB (ref 6–20)
CHLORIDE: 108 mmol/L (ref 98–111)
CO2: 23 mmol/L (ref 22–32)
CREATININE: 0.54 mg/dL (ref 0.44–1.00)
Calcium: 9.2 mg/dL (ref 8.9–10.3)
GFR calc Af Amer: >60 mL/min (ref >60)
GLUCOSE: 111 mg/dL — AB (ref 70–99)
POTASSIUM: 3.6 mmol/L (ref 3.5–5.1)
SODIUM: 140 mmol/L (ref 135–145)
Total Bilirubin: 2.1 mg/dL — ABNORMAL HIGH (ref 0.3–1.2)
Total Protein: 8.1 g/dL (ref 6.5–8.1)

## 2017-09-03 LAB — I-STAT BETA HCG BLOOD, ED (MC, WL, AP ONLY): I-stat hCG, quantitative: <5 m[IU]/mL (ref <5)

## 2017-09-03 LAB — CBG MONITORING, ED: GLUCOSE-CAPILLARY: 99 mg/dL (ref 70–99)

## 2017-09-03 MED ORDER — DIPHENHYDRAMINE HCL 25 MG PO CAPS
25.0000 mg | ORAL_CAPSULE | ORAL | Status: DC | PRN
  Administered 2017-09-03: 50 mg via ORAL
  Filled 2017-09-03: qty 2

## 2017-09-03 MED ORDER — SODIUM CHLORIDE 0.45 % IV SOLN
INTRAVENOUS | Status: DC
  Administered 2017-09-03: 13:00:00 via INTRAVENOUS

## 2017-09-03 MED ORDER — OXYCODONE-ACETAMINOPHEN 5-325 MG PO TABS
1.0000 | ORAL_TABLET | Freq: Once | ORAL | Status: AC
  Administered 2017-09-03: 1 via ORAL
  Filled 2017-09-03: qty 1

## 2017-09-03 MED ORDER — HYDROMORPHONE HCL 1 MG/ML IJ SOLN: 1.0000 mg | INTRAMUSCULAR | Status: AC

## 2017-09-03 MED ORDER — HYDROMORPHONE HCL 1 MG/ML IJ SOLN: 0.5000 mg | INTRAMUSCULAR | Status: AC

## 2017-09-03 MED ORDER — HYDROMORPHONE HCL 1 MG/ML IJ SOLN
1.0000 mg | INTRAMUSCULAR | Status: AC
  Administered 2017-09-03: 1 mg via INTRAVENOUS
  Filled 2017-09-03: qty 1

## 2017-09-03 MED ORDER — KETOROLAC TROMETHAMINE 30 MG/ML IJ SOLN
30.0000 mg | INTRAMUSCULAR | Status: AC
  Administered 2017-09-03: 30 mg via INTRAVENOUS
  Filled 2017-09-03: qty 1

## 2017-09-03 MED ORDER — ONDANSETRON HCL 4 MG/2ML IJ SOLN
4.0000 mg | INTRAMUSCULAR | Status: DC | PRN
  Administered 2017-09-03: 4 mg via INTRAVENOUS
  Filled 2017-09-03: qty 2

## 2017-09-03 MED ORDER — HYDROMORPHONE HCL 1 MG/ML IJ SOLN
0.5000 mg | INTRAMUSCULAR | Status: AC
  Administered 2017-09-03: 0.5 mg via INTRAVENOUS
  Filled 2017-09-03: qty 1

## 2017-09-03 MED ORDER — HYDROMORPHONE HCL 1 MG/ML IJ SOLN: 1.0000 mg | INTRAMUSCULAR | Status: DC

## 2017-09-03 MED ORDER — OXYCODONE-ACETAMINOPHEN 5-325 MG PO TABS: 1.0000 | ORAL_TABLET | Freq: Four times a day (QID) | ORAL | 0 refills | Status: AC | PRN

## 2017-09-03 NOTE — ED Triage Notes (Signed)
Pt states she is having her normal sickle cell pain today. Pt has pain in knees and back. Denies CP. Pt states she had some dizziness getting into the car.

## 2017-09-03 NOTE — Discharge Instructions (Addendum)
Please schedule an appointment to establish care with a regular doctor for further management of your sickle cell disease. I have listed information below to [facility] which is located across the street from [facility].   I have written you a prescription for percocet as needed for pain. This medicine can make you drowsy so please do not drive or work while taking it.   Return to the ER if you have any new or worsening symptoms like chest pain, fever, trouble breathing.

## 2017-09-03 NOTE — ED Provider Notes (Signed)
[facility] DEPT Provider Note   CSN: [id number] Arrival date & time: 09/03/17  1113     History   Chief Complaint Chief Complaint  Patient presents with  . Sickle Cell Pain Crisis    HPI Wendy [name] is a 27 y.o. female.  HPI   Wendy [name] is a 27 year old female with a history of sickle cell anemia, acute chest syndrome, tobacco use who presents to the emergency department for evaluation of sickle cell pain crisis.  Patient reports that she recently moved from [facility] and does not have a sickle cell doctor in the area.  She tried calling the sickle cell clinic, but they told her that they are no longer accepting patients.  She reports that she is out of pain medication at home.  States that her current pain crisis started about a week ago and has been getting worse.  She believes that her current crisis is triggered by the cold air conditioning in her sister's house.  She states that pain is located in the lower back and bilateral thighs.  It is 10/10 in severity and feels aching.  She reports pain is worsened by movement.  Denies recent trauma or injury.  She denies fever, chills, chest pain, shortness of breath, cough, abdominal pain, vomiting, numbness, weakness.  She is able to ambulate independently despite pain.  She denies recent exercise or high altitude climbing. She is eating a cheeseburger during exam.  Past Medical History:  Diagnosis Date  . Acute chest syndrome ([place]) 07/22/2017  . Anemia   . Sickle cell anemia (HCC)   . Tobacco abuse     Patient Active Problem List   Diagnosis Date Noted  . Acute chest syndrome ([place]) 07/22/2017  . Sickle cell anemia with pain ([place]) 05/12/2017  . Sickle cell pain crisis ([place]) 05/27/2016  . Leukocytosis 05/27/2016  . Sickle cell anemia (HCC)     Past Surgical History:  Procedure Laterality Date  . CESAREAN SECTION    . CESAREAN SECTION WITH BILATERAL TUBAL LIGATION       OB History    None      Home Medications    Prior to Admission medications   Medication Sig Start Date End Date Taking? Authorizing Provider  acetaminophen (TYLENOL) 500 MG tablet Take 1,500 mg by mouth daily as needed (sever pain).   Yes [provider]  Aspirin-Acetaminophen-Caffeine (GOODYS EXTRA STRENGTH PO) Take 1 packet by mouth daily as needed (severe pain).   Yes [provider]  etodolac (LODINE) 300 MG capsule Take 1 capsule (300 mg total) by mouth every 8 (eight) hours. 08/27/17  Yes Dorie Rank, MD  folic acid (FOLVITE) 1 MG tablet Take 1 tablet (1 mg total) by mouth daily. 08/20/17 08/20/18 Yes Dorena Dew, FNP  ibuprofen (ADVIL,MOTRIN) 200 MG tablet Take 600 mg by mouth daily as needed (severe pain).   Yes [provider]  oxyCODONE-acetaminophen (PERCOCET/ROXICET) 5-325 MG tablet Take 1 tablet by mouth every 6 (six) hours as needed for severe pain. 08/20/17  Yes Dorena Dew, FNP    Family History Family History  Problem Relation Age of Onset  . Sickle cell trait Father     Social History Social History   Tobacco Use  . Smoking status: Never Smoker  . Smokeless tobacco: Never Used  Substance Use Topics  . Alcohol use: No  . Drug use: No     Allergies   Patient has no known allergies.   Review of  Systems Review of Systems  Constitutional: Negative for chills and fever.  HENT: Negative for congestion and sore throat.   Respiratory: Negative for shortness of breath.   Cardiovascular: Negative for chest pain.  Gastrointestinal: Negative for abdominal pain and vomiting.  Genitourinary: Negative for difficulty urinating.  Musculoskeletal: Positive for back pain and myalgias (bilateral thighs).  Skin: Negative for color change.  Neurological: Negative for syncope, weakness, light-headedness, numbness and headaches.  Psychiatric/Behavioral: Negative for agitation.     Physical Exam Updated Vital Signs BP 122/83   Pulse 91   Temp  98.7 F (37.1 C) (Oral)   Resp 15   Ht 5\' 6"  (1.676 m)   Wt 58.5 kg (129 lb)   LMP 08/24/2017   SpO2 100%   BMI 20.82 kg/m   Physical Exam  Constitutional: She is oriented to person, place, and time. She appears well-developed and well-nourished. No distress.  Appears very comfortable, sitting at bedside eating.   HENT:  Head: Normocephalic and atraumatic.  Mouth/Throat: Oropharynx is clear and moist.  Eyes: Pupils are equal, round, and reactive to light. Conjunctivae are normal. Right eye exhibits no discharge. Left eye exhibits no discharge. No scleral icterus.  No scleral icterus  Neck: Normal range of motion. Neck supple.  Cardiovascular: Normal rate and regular rhythm.  Murmur (systolic 2/6) heard. Pulmonary/Chest: Effort normal and breath sounds normal. No stridor. No respiratory distress. She has no wheezes. She has no rales.  Abdominal: Soft. Bowel sounds are normal. There is no tenderness.  Musculoskeletal:  Tender to palpation over several spinous processes of the lumbar spine as well as bilateral paraspinal muscles of the lumbar spine. No rash overlying. Tender with palpation over bilateral thighs. No break in skin, swelling or erythema. Full ROM of bilateral hips, knees and ankles. DP pulses 2+ and symmetric bilaterally. Sensation to light touch intact in bilateral LE.   Neurological: She is alert and oriented to person, place, and time. Coordination normal.  Skin: Skin is warm and dry. Capillary refill takes less than 2 seconds. She is not diaphoretic.  Psychiatric: She has a normal mood and affect. Her behavior is normal.  Nursing note and vitals reviewed.    ED Treatments / Results  Labs (all labs ordered are listed, but only abnormal results are displayed) Labs Reviewed  COMPREHENSIVE METABOLIC PANEL - Abnormal; Notable for the following components:      Result Value   Glucose, Bld 111 (*)    BUN 5 (*)    Albumin 5.1 (*)    Alkaline Phosphatase 32 (*)     Total Bilirubin 2.1 (*)    All other components within normal limits  CBC WITH DIFFERENTIAL/PLATELET - Abnormal; Notable for the following components:   RBC 3.25 (*)    Hemoglobin 9.4 (*)    HCT 27.9 (*)    RDW 17.1 (*)    Platelets 434 (*)    All other components within normal limits  RETICULOCYTES - Abnormal; Notable for the following components:   Retic Ct Pct 5.5 (*)    RBC. 3.25 (*)    All other components within normal limits  CBG MONITORING, ED  I-STAT BETA HCG BLOOD, ED ([facility], [facility], [facility] ONLY)    EKG None  Radiology No results found.  Procedures Procedures (including critical care time)  Medications Ordered in ED Medications  0.45 % sodium chloride infusion ( Intravenous Rate/Dose Verify 09/03/17 1247)  HYDROmorphone (DILAUDID) injection 1 mg (has no administration in time range)    Or  HYDROmorphone (DILAUDID) injection 1 mg (has no administration in time range)  diphenhydrAMINE (BENADRYL) capsule 25-50 mg (50 mg Oral Given 09/03/17 1241)  ondansetron (ZOFRAN) injection 4 mg (4 mg Intravenous Given 09/03/17 1241)  ketorolac (TORADOL) 30 MG/ML injection 30 mg (30 mg Intravenous Given 09/03/17 1241)  HYDROmorphone (DILAUDID) injection 0.5 mg (0.5 mg Intravenous Given 09/03/17 1241)    Or  HYDROmorphone (DILAUDID) injection 0.5 mg ( Subcutaneous See Alternative 09/03/17 1241)  HYDROmorphone (DILAUDID) injection 1 mg (1 mg Intravenous Given 09/03/17 1315)    Or  HYDROmorphone (DILAUDID) injection 1 mg ( Subcutaneous See Alternative 09/03/17 1315)  HYDROmorphone (DILAUDID) injection 1 mg (1 mg Intravenous Given 09/03/17 1400)    Or  HYDROmorphone (DILAUDID) injection 1 mg ( Subcutaneous See Alternative 09/03/17 1400)     Initial Impression / Assessment and Plan / ED Course  I have reviewed the triage vital signs and the nursing notes.  Pertinent labs & imaging results that were available during my care of the patient were reviewed by me and considered in my medical decision making (see  chart for details).     Patient presents with sickle cell pain crisis.  Reports symptoms are similar to her typical crisis.  No fevers, chills, chest pain, shortness of breath.  Her vital signs are stable.  I do not suspect PE or acute chest syndrome given symptoms.  Labs unremarkable.  Her hemoglobin is 9.4 which is baseline.   Bilirubin 2.1 which is actually improved from her baseline.  Reticulocyte count percentage is elevated which is chronic. Sickle cell order set initiated.  Patient received 4 rounds of hydrocodone as well as half-normal saline and Toradol.  On recheck she reports that her pain has improved.  I will discharge with a short course of Percocet as needed for pain at home.  I have given her information to follow-up with a PCP given she reports the sickle cell clinic is not accepting patients at this time.  Counseled her on return precautions and she agrees and voiced understanding and appears reliable for follow-up.  Final Clinical Impressions(s) / ED Diagnoses   Final diagnoses:  Sickle cell crisis [facility])    ED Discharge Orders        Ordered    oxyCODONE-acetaminophen (PERCOCET/ROXICET) 5-325 MG tablet  Every 6 hours PRN     09/03/17 1450       Glyn Ade, PA-C 09/03/17 1453    Lajean Saver, MD 09/04/17 [phone]

## 2017-09-08 ENCOUNTER — Other Ambulatory Visit: Payer: Self-pay

## 2017-09-08 ENCOUNTER — Encounter (HOSPITAL_COMMUNITY): Payer: Self-pay

## 2017-09-08 ENCOUNTER — Emergency Department (HOSPITAL_COMMUNITY)
Admission: EM | Admit: 2017-09-08 | Discharge: 2017-09-08 | Disposition: A | Discharge status: Home / Self Care | Payer: Medicare Other | Attending: Emergency Medicine | Admitting: Emergency Medicine

## 2017-09-08 DIAGNOSIS — D5700 Hb-SS disease with crisis, unspecified: Secondary | ICD-10-CM | POA: Insufficient documentation

## 2017-09-08 DIAGNOSIS — Z79899 Other long term (current) drug therapy: Secondary | ICD-10-CM | POA: Diagnosis not present

## 2017-09-08 LAB — CBC WITH DIFFERENTIAL/PLATELET
BASOS ABS: 0 10*3/uL (ref 0.0–0.1)
Basophils Relative: 1 %
EOS PCT: 3 %
Eosinophils Absolute: 0.2 10*3/uL (ref 0.0–0.7)
HCT: 26.9 % — ABNORMAL LOW (ref 36.0–46.0)
Hemoglobin: 9.1 g/dL — ABNORMAL LOW (ref 12.0–15.0)
Lymphocytes Relative: 28 %
Lymphs Abs: 2.2 10*3/uL (ref 0.7–4.0)
MCH: 29.6 pg (ref 26.0–34.0)
MCHC: 33.8 g/dL (ref 30.0–36.0)
MCV: 87.6 fL (ref 78.0–100.0)
MONO ABS: 0.6 10*3/uL (ref 0.1–1.0)
MONOS PCT: 7 %
Neutro Abs: 4.7 10*3/uL (ref 1.7–7.7)
Neutrophils Relative %: 61 %
PLATELETS: 518 10*3/uL — AB (ref 150–400)
RBC: 3.07 MIL/uL — ABNORMAL LOW (ref 3.87–5.11)
RDW: 18.3 % — AB (ref 11.5–15.5)
WBC: 7.7 10*3/uL (ref 4.0–10.5)

## 2017-09-08 LAB — COMPREHENSIVE METABOLIC PANEL
ALBUMIN: 5 g/dL (ref 3.5–5.0)
ALK PHOS: 30 U/L — AB (ref 38–126)
ALT: 13 U/L (ref 0–44)
AST: 24 U/L (ref 15–41)
Anion gap: 9 (ref 5–15)
BILIRUBIN TOTAL: 2.4 mg/dL — AB (ref 0.3–1.2)
BUN: 7 mg/dL (ref 6–20)
CALCIUM: 9.3 mg/dL (ref 8.9–10.3)
CO2: 25 mmol/L (ref 22–32)
Chloride: 108 mmol/L (ref 98–111)
Creatinine, Ser: 0.59 mg/dL (ref 0.44–1.00)
GFR calc Af Amer: >60 mL/min (ref >60)
GFR calc non Af Amer: >60 mL/min (ref >60)
GLUCOSE: 96 mg/dL (ref 70–99)
Potassium: 3.7 mmol/L (ref 3.5–5.1)
SODIUM: 142 mmol/L (ref 135–145)
TOTAL PROTEIN: 7.7 g/dL (ref 6.5–8.1)

## 2017-09-08 LAB — RETICULOCYTES
RBC.: 3.07 MIL/uL — ABNORMAL LOW (ref 3.87–5.11)
RETIC COUNT ABSOLUTE: 414.5 10*3/uL — AB (ref 19.0–186.0)
Retic Ct Pct: 13.5 % — ABNORMAL HIGH (ref 0.4–3.1)

## 2017-09-08 MED ORDER — HYDROMORPHONE HCL 1 MG/ML IJ SOLN: 1.0000 mg | INTRAMUSCULAR | Status: DC

## 2017-09-08 MED ORDER — HYDROMORPHONE HCL 1 MG/ML IJ SOLN: 1.0000 mg | INTRAMUSCULAR | Status: AC

## 2017-09-08 MED ORDER — DIPHENHYDRAMINE HCL 50 MG/ML IJ SOLN
25.0000 mg | Freq: Once | INTRAMUSCULAR | Status: AC
  Administered 2017-09-08: 25 mg via INTRAVENOUS
  Filled 2017-09-08: qty 1

## 2017-09-08 MED ORDER — HYDROMORPHONE HCL 1 MG/ML IJ SOLN
1.0000 mg | INTRAMUSCULAR | Status: AC
  Administered 2017-09-08: 1 mg via INTRAVENOUS
  Filled 2017-09-08: qty 1

## 2017-09-08 MED ORDER — ONDANSETRON HCL 4 MG/2ML IJ SOLN
4.0000 mg | Freq: Once | INTRAMUSCULAR | Status: AC
  Administered 2017-09-08: 4 mg via INTRAVENOUS
  Filled 2017-09-08: qty 2

## 2017-09-08 MED ORDER — HYDROMORPHONE HCL 1 MG/ML IJ SOLN: 0.5000 mg | INTRAMUSCULAR | Status: AC

## 2017-09-08 MED ORDER — SODIUM CHLORIDE 0.45 % IV SOLN: INTRAVENOUS | Status: DC

## 2017-09-08 MED ORDER — HYDROMORPHONE HCL 1 MG/ML IJ SOLN
0.5000 mg | INTRAMUSCULAR | Status: AC
  Administered 2017-09-08: 0.5 mg via INTRAVENOUS
  Filled 2017-09-08: qty 1

## 2017-09-08 MED ORDER — PROMETHAZINE HCL 25 MG PO TABS: 25.0000 mg | ORAL_TABLET | ORAL | Status: DC | PRN

## 2017-09-08 NOTE — ED Notes (Signed)
Unsuccessful IV attempt x1. IV consult placed. 

## 2017-09-08 NOTE — ED Notes (Signed)
Mike RN at bedside attempting US IV 

## 2017-09-08 NOTE — ED Notes (Signed)
Patient given cheese and crackers. 

## 2017-09-08 NOTE — ED Notes (Signed)
ED Provider at bedside. 

## 2017-09-08 NOTE — ED Notes (Signed)
Patient given sandwich and water

## 2017-09-08 NOTE — Discharge Instructions (Addendum)
Please make sure that you are staying well-hydrated.  Please call the sickle cell clinic for an appointment.  Please take Ibuprofen (Advil, motrin) and Tylenol (acetaminophen) to relieve your pain.  You may take up to 600 MG (3 pills) of normal strength ibuprofen every 8 hours as needed.  In between doses of ibuprofen you make take tylenol, up to 1,000 mg (two extra strength pills).  Do not take more than 3,000 mg tylenol in a 24 hour period.  Please check all medication labels as many medications such as pain and cold medications may contain tylenol.  Do not drink alcohol while taking these medications.  Do not take other NSAID'S while taking ibuprofen (such as aleve or naproxen).  Please take ibuprofen with food to decrease stomach upset.  Today you received medications that may make you sleepy or impair your ability to make decisions.  For the next 24 hours please do not drive, operate heavy machinery, care for a small child with out another adult present, or perform any activities that may cause harm to you or someone else if you were to fall asleep or be impaired.

## 2017-09-08 NOTE — ED Triage Notes (Signed)
Pt BIBA from home. Pt states that she is having her normal sickle cell pain in her legs and back. VSS en route. Denies CP.

## 2017-09-08 NOTE — ED Notes (Signed)
Bed: [facility] Expected date:  Expected time:  Means of arrival:  Comments: 

## 2017-09-08 NOTE — ED Provider Notes (Signed)
[facility] DEPT Provider Note   CSN: [id number] Arrival date & time: 09/08/17  1225     History   Chief Complaint Chief Complaint  Patient presents with  . Sickle Cell Pain Crisis    HPI Wendy [name] is a 27 y.o. female with a past medical history of sickle cell anemia, tobacco abuse, who presents today for evaluation of sickle cell pain crisis.  She reports that since yesterday she has been having her normal sickle cell pain.  This is in her legs and her back.  She denies any chest pain shortness of breath abdominal pain nausea vomiting or diarrhea.  No fevers.  She reports that this feels like her usual sickle cell pain crises and is not concerned that it may be anything other than sickle cell.     HPI  Past Medical History:  Diagnosis Date  . Acute chest syndrome ([place]) 07/22/2017  . Anemia   . Sickle cell anemia (HCC)   . Tobacco abuse     Patient Active Problem List   Diagnosis Date Noted  . Acute chest syndrome ([place]) 07/22/2017  . Sickle cell anemia with pain ([place]) 05/12/2017  . Sickle cell pain crisis ([place]) 05/27/2016  . Leukocytosis 05/27/2016  . Sickle cell anemia (HCC)     Past Surgical History:  Procedure Laterality Date  . CESAREAN SECTION    . CESAREAN SECTION WITH BILATERAL TUBAL LIGATION       OB History   None      Home Medications    Prior to Admission medications   Medication Sig Start Date End Date Taking? Authorizing Provider  acetaminophen (TYLENOL) 500 MG tablet Take 1,500 mg by mouth daily as needed (sever pain).   Yes [provider]  Aspirin-Acetaminophen-Caffeine (GOODYS EXTRA STRENGTH PO) Take 1 packet by mouth daily as needed (severe pain).   Yes [provider]  etodolac (LODINE) 300 MG capsule Take 1 capsule (300 mg total) by mouth every 8 (eight) hours. 08/27/17  Yes Dorie Rank, MD  folic acid (FOLVITE) 1 MG tablet Take 1 tablet (1 mg total) by mouth daily. 08/20/17 08/20/18 Yes  Dorena Dew, FNP  ibuprofen (ADVIL,MOTRIN) 200 MG tablet Take 600 mg by mouth daily as needed (severe pain).   Yes [provider]  oxyCODONE-acetaminophen (PERCOCET/ROXICET) 5-325 MG tablet Take 1 tablet by mouth every 6 (six) hours as needed for severe pain. 09/03/17  Yes Glyn Ade, PA-C    Family History Family History  Problem Relation Age of Onset  . Sickle cell trait Father     Social History Social History   Tobacco Use  . Smoking status: Never Smoker  . Smokeless tobacco: Never Used  Substance Use Topics  . Alcohol use: No  . Drug use: No     Allergies   Patient has no known allergies.   Review of Systems Review of Systems  Constitutional: Negative for chills and fever.  HENT: Negative for ear pain and sore throat.   Eyes: Negative for pain and visual disturbance.  Respiratory: Negative for cough and shortness of breath.   Cardiovascular: Negative for chest pain and palpitations.  Gastrointestinal: Negative for abdominal pain and vomiting.  Genitourinary: Negative for dysuria and hematuria.  Musculoskeletal: Positive for arthralgias, back pain and myalgias. Negative for neck pain and neck stiffness.  Skin: Negative for color change and rash.  Neurological: Negative for seizures, syncope and headaches.  All other systems reviewed and are negative.  Physical Exam Updated Vital Signs BP 131/82   Pulse 81   Temp 98.9 F (37.2 C) (Oral)   Resp 16   Ht 5\' 6"  (1.676 m)   Wt 58.5 kg (129 lb)   LMP 08/24/2017   SpO2 98%   BMI 20.82 kg/m   Physical Exam  Constitutional: She is oriented to person, place, and time. She appears well-developed and well-nourished. No distress.  HENT:  Head: Normocephalic and atraumatic.  Mouth/Throat: Oropharynx is clear and moist.  Eyes: Conjunctivae are normal. No scleral icterus.  Neck: Normal range of motion. Neck supple. No JVD present.  Cardiovascular: Normal rate, regular rhythm, normal heart  sounds and intact distal pulses. Exam reveals no friction rub.  No murmur heard. Pulmonary/Chest: Effort normal and breath sounds normal. No respiratory distress.  Abdominal: Soft. Bowel sounds are normal. There is no tenderness.  Musculoskeletal: She exhibits tenderness (Diffuse TTP over bilateral arms, legs, and lower back). She exhibits no edema or deformity.  Neurological: She is alert and oriented to person, place, and time.  Skin: Skin is warm and dry.  Psychiatric: She has a normal mood and affect.  Nursing note and vitals reviewed.    ED Treatments / Results  Labs (all labs ordered are listed, but only abnormal results are displayed) Labs Reviewed  COMPREHENSIVE METABOLIC PANEL - Abnormal; Notable for the following components:      Result Value   Alkaline Phosphatase 30 (*)    Total Bilirubin 2.4 (*)    All other components within normal limits  CBC WITH DIFFERENTIAL/PLATELET - Abnormal; Notable for the following components:   RBC 3.07 (*)    Hemoglobin 9.1 (*)    HCT 26.9 (*)    RDW 18.3 (*)    Platelets 518 (*)    All other components within normal limits  RETICULOCYTES - Abnormal; Notable for the following components:   Retic Ct Pct 13.5 (*)    RBC. 3.07 (*)    Retic Count, Absolute 414.5 (*)    All other components within normal limits  RPR  HIV ANTIBODY (ROUTINE TESTING)    EKG None  Radiology No results found.  Procedures Procedures (including critical care time)  Medications Ordered in ED Medications  HYDROmorphone (DILAUDID) injection 0.5 mg (0.5 mg Intravenous Given 09/08/17 1628)    Or  HYDROmorphone (DILAUDID) injection 0.5 mg ( Subcutaneous See Alternative 09/08/17 1628)  HYDROmorphone (DILAUDID) injection 1 mg (1 mg Intravenous Given 09/08/17 1720)    Or  HYDROmorphone (DILAUDID) injection 1 mg ( Subcutaneous See Alternative 09/08/17 1720)  HYDROmorphone (DILAUDID) injection 1 mg (1 mg Intravenous Given 09/08/17 1828)    Or  HYDROmorphone (DILAUDID)  injection 1 mg ( Subcutaneous See Alternative 09/08/17 1828)  diphenhydrAMINE (BENADRYL) injection 25 mg (25 mg Intravenous Given 09/08/17 1627)  ondansetron (ZOFRAN) injection 4 mg (4 mg Intravenous Given 09/08/17 1627)  diphenhydrAMINE (BENADRYL) injection 25 mg (25 mg Intravenous Given 09/08/17 1827)     Initial Impression / Assessment and Plan / ED Course  I have reviewed the triage vital signs and the nursing notes.  Pertinent labs & imaging results that were available during my care of the patient were reviewed by me and considered in my medical decision making (see chart for details).  Clinical Course as of Sep 09 128  Tue Sep 08, 2017  1537 Last ibuprofen shortly PTA, took 600mg .    [EH]    Clinical Course User Index [EH] Lorin Glass, PA-C   Patient presents today  for evaluation of sickle cell pain crisis.  She reports that this pain feels like her usual sickle cell pain and is not different in any way.  She says that it is not concerning for other more serious causes.  She was treated with sickle cell protocol after which her pain improved and she requested discharge.  He denied chest pain or shortness of breath.  Labs were consistent with her baseline.  Her hemoglobin tends to run around 9 and is 9.1 today.    She is afebrile.  She did however reports that she has been having unprotected sex recently and is not having symptoms, however she wishes to be checked for HIV.  HIV and syphilis testing placed.   Return precautions were discussed with patient who states their understanding.  At the time of discharge patient denied any unaddressed complaints or concerns.  Patient is agreeable for discharge home.   Final Clinical Impressions(s) / ED Diagnoses   Final diagnoses:  Sickle cell pain crisis [facility])    ED Discharge Orders    None       Lorin Glass, PA-C 09/09/17 Viann Fish, MD 09/09/17 [phone]

## 2017-09-09 LAB — RPR: RPR Ser Ql: NONREACTIVE

## 2017-09-09 LAB — HIV ANTIBODY (ROUTINE TESTING W REFLEX): HIV SCREEN 4TH GENERATION: NONREACTIVE

## 2017-09-14 ENCOUNTER — Emergency Department (HOSPITAL_COMMUNITY)
Admission: EM | Admit: 2017-09-14 | Discharge: 2017-09-14 | Disposition: A | Discharge status: Home / Self Care | Payer: Medicare Other | Attending: Emergency Medicine | Admitting: Emergency Medicine

## 2017-09-14 ENCOUNTER — Other Ambulatory Visit: Payer: Self-pay

## 2017-09-14 DIAGNOSIS — Z79899 Other long term (current) drug therapy: Secondary | ICD-10-CM | POA: Diagnosis not present

## 2017-09-14 DIAGNOSIS — D5700 Hb-SS disease with crisis, unspecified: Secondary | ICD-10-CM | POA: Diagnosis not present

## 2017-09-14 DIAGNOSIS — M545 Low back pain: Secondary | ICD-10-CM | POA: Diagnosis present

## 2017-09-14 LAB — CBC WITH DIFFERENTIAL/PLATELET
BASOS PCT: 1 %
Basophils Absolute: 0.1 10*3/uL (ref 0.0–0.1)
EOS ABS: 0.2 10*3/uL (ref 0.0–0.7)
EOS PCT: 2 %
HCT: 29.3 % — ABNORMAL LOW (ref 36.0–46.0)
Hemoglobin: 9.9 g/dL — ABNORMAL LOW (ref 12.0–15.0)
LYMPHS ABS: 3.3 10*3/uL (ref 0.7–4.0)
Lymphocytes Relative: 39 %
MCH: 28.9 pg (ref 26.0–34.0)
MCHC: 33.8 g/dL (ref 30.0–36.0)
MCV: 85.7 fL (ref 78.0–100.0)
Monocytes Absolute: 0.5 10*3/uL (ref 0.1–1.0)
Monocytes Relative: 6 %
Neutro Abs: 4.6 10*3/uL (ref 1.7–7.7)
Neutrophils Relative %: 52 %
PLATELETS: 585 10*3/uL — AB (ref 150–400)
RBC: 3.42 MIL/uL — AB (ref 3.87–5.11)
RDW: 17.4 % — ABNORMAL HIGH (ref 11.5–15.5)
WBC: 8.6 10*3/uL (ref 4.0–10.5)

## 2017-09-14 LAB — I-STAT BETA HCG BLOOD, ED (MC, WL, AP ONLY)

## 2017-09-14 LAB — COMPREHENSIVE METABOLIC PANEL
ALK PHOS: 33 U/L — AB (ref 38–126)
ALT: 14 U/L (ref 0–44)
AST: 31 U/L (ref 15–41)
Albumin: 6.1 g/dL — ABNORMAL HIGH (ref 3.5–5.0)
Anion gap: 8 (ref 5–15)
BILIRUBIN TOTAL: 2.4 mg/dL — AB (ref 0.3–1.2)
BUN: 7 mg/dL (ref 6–20)
CHLORIDE: 109 mmol/L (ref 98–111)
CO2: 25 mmol/L (ref 22–32)
Calcium: 10 mg/dL (ref 8.9–10.3)
Creatinine, Ser: 0.51 mg/dL (ref 0.44–1.00)
Glucose, Bld: 95 mg/dL (ref 70–99)
Potassium: 3.9 mmol/L (ref 3.5–5.1)
Sodium: 142 mmol/L (ref 135–145)
Total Protein: 9.2 g/dL — ABNORMAL HIGH (ref 6.5–8.1)

## 2017-09-14 LAB — RETICULOCYTES
RBC.: 3.42 MIL/uL — AB (ref 3.87–5.11)
Retic Count, Absolute: 218.9 10*3/uL — ABNORMAL HIGH (ref 19.0–186.0)
Retic Ct Pct: 6.4 % — ABNORMAL HIGH (ref 0.4–3.1)

## 2017-09-14 MED ORDER — KETOROLAC TROMETHAMINE 30 MG/ML IJ SOLN
30.0000 mg | Freq: Once | INTRAMUSCULAR | Status: AC
  Administered 2017-09-14: 30 mg via INTRAVENOUS
  Filled 2017-09-14: qty 1

## 2017-09-14 MED ORDER — OXYCODONE-ACETAMINOPHEN 5-325 MG PO TABS
1.0000 | ORAL_TABLET | Freq: Once | ORAL | Status: AC
  Administered 2017-09-14: 1 via ORAL
  Filled 2017-09-14: qty 1

## 2017-09-14 MED ORDER — DIPHENHYDRAMINE HCL 50 MG/ML IJ SOLN
25.0000 mg | Freq: Once | INTRAMUSCULAR | Status: AC
  Administered 2017-09-14: 25 mg via INTRAVENOUS
  Filled 2017-09-14: qty 1

## 2017-09-14 NOTE — Discharge Instructions (Signed)
Thank you for allowing me to care for you today in the Emergency Department.   Please follow-up with your primary care provider for refills of your home medication.  Take 600 mg of ibuprofen with food or 650 mg of Tylenol for pain control.  Return to the emergency department if you develop new or worsening symptoms.

## 2017-09-14 NOTE — ED Notes (Signed)
Writer attempted to collect blood X2, unsuccessful.

## 2017-09-14 NOTE — Progress Notes (Addendum)
CSW received a call from [employer] stating pt needs transportation home. Per chart pt is a sickle cell pt presenting with extreme pain to back and leg(s). Pt has a 27 year old son with her and pt's home is not on the direct bus route.  Pt was counseled that taxi voucher will not always be available in the future due to a shortage of taxi voucher available to ED staff and that plans will have to be made by the pt in the future, so that the pt/pt's son will not have to walk home from the nearest possible bus route at this late hour.  Pt voiced understanding that in the future plans will have to be made in advance and was appreciative and thanked the CSW.  CSW spent time with pt's son about pt's son's future plans and future interests and actively listened and provided validation to the pt's son regarding pt's son's future aspirations.  RN is taking pt/pt's son to the taxi with taxi voucher provided by the [place].  Please reconsult if future social work needs arise.  CSW signing off, as social work intervention is no longer needed.  Alphonse Guild. Cephus Tupy, LCSW, LCAS, CSI Clinical Social Worker Ph: [phone]

## 2017-09-14 NOTE — ED Notes (Signed)
ED Provider at bedside. 

## 2017-09-14 NOTE — ED Provider Notes (Signed)
[facility] DEPT Provider Note   CSN: [id number] Arrival date & time: 09/14/17  1451     History   Chief Complaint Chief Complaint  Patient presents with  . Sickle Cell Pain Crisis    HPI Wendy [name] is a 27 y.o. female with a history of Hb-SS, acute chest syndrome, and tobacco abuse who presents to the emergency department with a chief complaint of sickle cell pain.  The patient reports sudden onset this morning of her usual sickle cell pain, which she describes as pain in the bilateral thighs, left greater than right, and her bilateral low back.  She is out of her home Percocet and has not taken any other medications for her pain prior to arrival.  She denies chest pain, dyspnea, abdominal pain, nausea, vomiting, diarrhea, numbness, weakness, dysuria, or GU complaints.  She reports that she is currently living with her sister.  She states that she thinks that an exacerbation for her sickle cell pain as the fact that her sister keeps the air conditioning running all the time.  Of note, the patient has a 14 visits to the ED for similar complaints in the last 6 months.  The history is provided by the patient. No language interpreter was used.    Past Medical History:  Diagnosis Date  . Acute chest syndrome ([place]) 07/22/2017  . Anemia   . Sickle cell anemia (HCC)   . Tobacco abuse     Patient Active Problem List   Diagnosis Date Noted  . Acute chest syndrome ([place]) 07/22/2017  . Sickle cell anemia with pain ([place]) 05/12/2017  . Sickle cell pain crisis ([place]) 05/27/2016  . Leukocytosis 05/27/2016  . Sickle cell anemia (HCC)     Past Surgical History:  Procedure Laterality Date  . CESAREAN SECTION    . CESAREAN SECTION WITH BILATERAL TUBAL LIGATION       OB History   None      Home Medications    Prior to Admission medications   Medication Sig Start Date End Date Taking? Authorizing Provider  acetaminophen (TYLENOL) 500 MG tablet  Take 1,500 mg by mouth daily as needed (sever pain).   Yes [provider]  etodolac (LODINE) 300 MG capsule Take 1 capsule (300 mg total) by mouth every 8 (eight) hours. 08/27/17  Yes Dorie Rank, MD  folic acid (FOLVITE) 1 MG tablet Take 1 tablet (1 mg total) by mouth daily. 08/20/17 08/20/18 Yes Dorena Dew, FNP  ibuprofen (ADVIL,MOTRIN) 200 MG tablet Take 600 mg by mouth daily as needed (severe pain).   Yes [provider]  oxyCODONE-acetaminophen (PERCOCET/ROXICET) 5-325 MG tablet Take 1 tablet by mouth every 6 (six) hours as needed for severe pain. 09/03/17  Yes Glyn Ade, PA-C    Family History Family History  Problem Relation Age of Onset  . Sickle cell trait Father     Social History Social History   Tobacco Use  . Smoking status: Never Smoker  . Smokeless tobacco: Never Used  Substance Use Topics  . Alcohol use: No  . Drug use: No     Allergies   Patient has no known allergies.   Review of Systems Review of Systems  Constitutional: Negative for activity change, chills and fever.  HENT: Negative for congestion and sore throat.   Eyes: Negative for visual disturbance.  Respiratory: Negative for shortness of breath.   Cardiovascular: Negative for chest pain.  Gastrointestinal: Negative for abdominal pain, diarrhea, nausea and vomiting.  Genitourinary: Negative for difficulty urinating, dysuria, frequency, menstrual problem, vaginal bleeding and vaginal discharge.  Musculoskeletal: Positive for back pain and myalgias. Negative for joint swelling and neck pain.  Skin: Negative for rash.  Allergic/Immunologic: Negative for immunocompromised state.  Neurological: Negative for weakness, numbness and headaches.  Psychiatric/Behavioral: Negative for confusion.   Physical Exam Updated Vital Signs BP 109/76 (BP Location: Right Arm)   Pulse (!) 111   Temp 98.4 F (36.9 C) (Oral)   Resp 16   LMP 08/24/2017   SpO2 97%   Physical Exam    Constitutional: No distress.  Well appearing. No acute distress.   HENT:  Head: Normocephalic.  Eyes: Conjunctivae are normal.  Neck: Neck supple.  Cardiovascular: Normal rate, regular rhythm, normal heart sounds and intact distal pulses. Exam reveals no gallop and no friction rub.  No murmur heard. Pulmonary/Chest: Effort normal. No stridor. No respiratory distress. She has no wheezes. She has no rales. She exhibits no tenderness.  Abdominal: Soft. She exhibits no distension and no mass. There is no tenderness. There is no rebound and no guarding. No hernia.  Musculoskeletal:  Minimally reproducible tenderness to the lumbar musculature bilaterally.  Bilateral thighs and calves are nontender to palpation.  Radial, DP, PT pulses are 2+ and symmetric.  Sensation is intact and symmetric throughout.  Neurological: She is alert.  Skin: Skin is warm. No rash noted.  Psychiatric: Her behavior is normal.  Nursing note and vitals reviewed.  ED Treatments / Results  Labs (all labs ordered are listed, but only abnormal results are displayed) Labs Reviewed  COMPREHENSIVE METABOLIC PANEL - Abnormal; Notable for the following components:      Result Value   Total Protein 9.2 (*)    Albumin 6.1 (*)    Alkaline Phosphatase 33 (*)    Total Bilirubin 2.4 (*)    All other components within normal limits  CBC WITH DIFFERENTIAL/PLATELET - Abnormal; Notable for the following components:   RBC 3.42 (*)    Hemoglobin 9.9 (*)    HCT 29.3 (*)    RDW 17.4 (*)    Platelets 585 (*)    All other components within normal limits  RETICULOCYTES - Abnormal; Notable for the following components:   Retic Ct Pct 6.4 (*)    RBC. 3.42 (*)    Retic Count, Absolute 218.9 (*)    All other components within normal limits  I-STAT BETA HCG BLOOD, ED ([facility], [facility], [facility] ONLY)    EKG None  Radiology No results found.  Procedures Procedures (including critical care time)  Medications Ordered in ED Medications   oxyCODONE-acetaminophen (PERCOCET/ROXICET) 5-325 MG per tablet 1 tablet (1 tablet Oral Given 09/14/17 1921)  ketorolac (TORADOL) 30 MG/ML injection 30 mg (30 mg Intravenous Given 09/14/17 1921)  diphenhydrAMINE (BENADRYL) injection 25 mg (25 mg Intravenous Given 09/14/17 1922)     Initial Impression / Assessment and Plan / ED Course  I have reviewed the triage vital signs and the nursing notes.  Pertinent labs & imaging results that were available during my care of the patient were reviewed by me and considered in my medical decision making (see chart for details).     27 year old female with a history of Hb-SS, acute chest syndrome, and tobacco abuse who is well-known to this emergency department with 14 visits in the last 6 months.  She is here, accompanied by her 66-year-old son, for her usual sickle cell pain.  No chest pain or dyspnea.  Doubt acute chest syndrome.  The patient reports that she is out of her home Percocet.  She does not have follow-up, per her, with the sickle cell clinic until next week.  [place] controlled substance database reviewed, which shows:  received  -12 tablets of Percocet from the ED on 8/1 - 60 tablets from her PCP on 07/18 - 12 tablets from the ED on 7/2 - 60 tablets from PCP on 6/24 These are in addition to 3 other prescriptions from the ED since March 2019.  There are some inconsistencies in the patient's story.  She states that she just moved here from [place], [place] and is currently staying with her sister, but then states that she does not have any family in the area and is essentially homeless.  Consulted social work who has spoken with the patient and provided her resources.  Lengthy discussion with the patient as she is here today with her 16-year-old son.  I do not feel appropriate giving the patient multiple doses of pain medication from the sickle cell order set since she is accompanied by her child.  She states that she came to the ED by EMS, but has a  ride to pick her up.  The individual that she is referring to is at work and is not available until 10 PM, 4 to 5 hours from this timeframe.  Discussed that I will provide the patient with 1 dose of Toradol, Benadryl, and 1 dose of her home Percocet, and I have advised her to follow-up with her primary care provider and sickle cell team for refills of her home medication.  Strict return precautions given.  The patient is hemodynamically stable and in no acute distress.  She is safe for discharge to home with outpatient follow-up at this time.  Final Clinical Impressions(s) / ED Diagnoses   Final diagnoses:  Sickle cell anemia with pain [facility])    ED Discharge Orders    None       Blane Worthington A, PA-C 09/14/17 2116    Daleen Bo, MD 09/15/17 1104

## 2017-09-14 NOTE — ED Triage Notes (Signed)
Per [facility] pt from home for sickle cell back pain and leg pain for several weeks and reported to EMS "worst pain ever". Vitals: 112/81, HR 81, 19R, 98%.

## 2017-09-15 ENCOUNTER — Emergency Department (HOSPITAL_COMMUNITY)
Admission: EM | Admit: 2017-09-15 | Discharge: 2017-09-15 | Disposition: A | Discharge status: Home / Self Care | Payer: Medicare Other | Attending: Emergency Medicine | Admitting: Emergency Medicine

## 2017-09-15 ENCOUNTER — Emergency Department (HOSPITAL_COMMUNITY): Payer: Medicare Other

## 2017-09-15 DIAGNOSIS — F17210 Nicotine dependence, cigarettes, uncomplicated: Secondary | ICD-10-CM | POA: Insufficient documentation

## 2017-09-15 DIAGNOSIS — Z5320 Procedure and treatment not carried out because of patient's decision for unspecified reasons: Secondary | ICD-10-CM | POA: Insufficient documentation

## 2017-09-15 DIAGNOSIS — G8929 Other chronic pain: Secondary | ICD-10-CM

## 2017-09-15 DIAGNOSIS — R0602 Shortness of breath: Secondary | ICD-10-CM | POA: Insufficient documentation

## 2017-09-15 DIAGNOSIS — Z79899 Other long term (current) drug therapy: Secondary | ICD-10-CM | POA: Insufficient documentation

## 2017-09-15 DIAGNOSIS — D75839 Thrombocytosis, unspecified: Secondary | ICD-10-CM

## 2017-09-15 DIAGNOSIS — D473 Essential (hemorrhagic) thrombocythemia: Secondary | ICD-10-CM | POA: Diagnosis not present

## 2017-09-15 DIAGNOSIS — D5700 Hb-SS disease with crisis, unspecified: Secondary | ICD-10-CM | POA: Insufficient documentation

## 2017-09-15 LAB — CBC WITH DIFFERENTIAL/PLATELET
Basophils Absolute: 0 10*3/uL (ref 0.0–0.1)
Basophils Relative: 1 %
EOS PCT: 3 %
Eosinophils Absolute: 0.2 10*3/uL (ref 0.0–0.7)
HCT: 26.8 % — ABNORMAL LOW (ref 36.0–46.0)
Hemoglobin: 9.1 g/dL — ABNORMAL LOW (ref 12.0–15.0)
LYMPHS ABS: 2.5 10*3/uL (ref 0.7–4.0)
LYMPHS PCT: 36 %
MCH: 29.1 pg (ref 26.0–34.0)
MCHC: 34 g/dL (ref 30.0–36.0)
MCV: 85.6 fL (ref 78.0–100.0)
MONOS PCT: 8 %
Monocytes Absolute: 0.6 10*3/uL (ref 0.1–1.0)
Neutro Abs: 3.7 10*3/uL (ref 1.7–7.7)
Neutrophils Relative %: 52 %
Platelets: 525 10*3/uL — ABNORMAL HIGH (ref 150–400)
RBC: 3.13 MIL/uL — AB (ref 3.87–5.11)
RDW: 17.3 % — ABNORMAL HIGH (ref 11.5–15.5)
WBC: 7 10*3/uL (ref 4.0–10.5)

## 2017-09-15 LAB — COMPREHENSIVE METABOLIC PANEL
ALK PHOS: 29 U/L — AB (ref 38–126)
ALT: 10 U/L (ref 0–44)
AST: 26 U/L (ref 15–41)
Albumin: 5.2 g/dL — ABNORMAL HIGH (ref 3.5–5.0)
Anion gap: 7 (ref 5–15)
BUN: 9 mg/dL (ref 6–20)
CALCIUM: 9.3 mg/dL (ref 8.9–10.3)
CO2: 27 mmol/L (ref 22–32)
CREATININE: 0.62 mg/dL (ref 0.44–1.00)
Chloride: 107 mmol/L (ref 98–111)
Glucose, Bld: 83 mg/dL (ref 70–99)
Potassium: 3.8 mmol/L (ref 3.5–5.1)
Sodium: 141 mmol/L (ref 135–145)
Total Bilirubin: 2.1 mg/dL — ABNORMAL HIGH (ref 0.3–1.2)
Total Protein: 8.3 g/dL — ABNORMAL HIGH (ref 6.5–8.1)

## 2017-09-15 LAB — I-STAT BETA HCG BLOOD, ED (MC, WL, AP ONLY): I-stat hCG, quantitative: <5 m[IU]/mL (ref <5)

## 2017-09-15 LAB — I-STAT TROPONIN, ED: Troponin i, poc: 0.01 ng/mL (ref 0.00–0.08)

## 2017-09-15 LAB — URINALYSIS, ROUTINE W REFLEX MICROSCOPIC
BILIRUBIN URINE: NEGATIVE
Glucose, UA: NEGATIVE mg/dL
Hgb urine dipstick: NEGATIVE
KETONES UR: NEGATIVE mg/dL
Leukocytes, UA: NEGATIVE
NITRITE: NEGATIVE
Protein, ur: NEGATIVE mg/dL
Specific Gravity, Urine: 1.012 (ref 1.005–1.030)
pH: 6 (ref 5.0–8.0)

## 2017-09-15 LAB — LACTATE DEHYDROGENASE: LDH: 235 U/L — ABNORMAL HIGH (ref 98–192)

## 2017-09-15 LAB — RETICULOCYTES
RBC.: 3.13 MIL/uL — AB (ref 3.87–5.11)
RETIC CT PCT: 6 % — AB (ref 0.4–3.1)
Retic Count, Absolute: 187.8 10*3/uL — ABNORMAL HIGH (ref 19.0–186.0)

## 2017-09-15 MED ORDER — HYDROMORPHONE HCL 2 MG/ML IJ SOLN: 2.0000 mg | INTRAMUSCULAR | Status: DC

## 2017-09-15 MED ORDER — DIPHENHYDRAMINE HCL 25 MG PO CAPS: 25.0000 mg | ORAL_CAPSULE | ORAL | Status: DC | PRN

## 2017-09-15 MED ORDER — KETOROLAC TROMETHAMINE 30 MG/ML IJ SOLN
30.0000 mg | INTRAMUSCULAR | Status: AC
  Administered 2017-09-15: 30 mg via INTRAVENOUS
  Filled 2017-09-15: qty 1

## 2017-09-15 MED ORDER — HYDROMORPHONE HCL 2 MG/ML IJ SOLN
2.0000 mg | INTRAMUSCULAR | Status: AC
  Administered 2017-09-15: 2 mg via INTRAVENOUS
  Filled 2017-09-15: qty 1

## 2017-09-15 MED ORDER — DEXTROSE-NACL 5-0.45 % IV SOLN
INTRAVENOUS | Status: DC
  Administered 2017-09-15: 16:00:00 via INTRAVENOUS

## 2017-09-15 MED ORDER — ONDANSETRON HCL 4 MG/2ML IJ SOLN: 4.0000 mg | INTRAMUSCULAR | Status: DC | PRN

## 2017-09-15 MED ORDER — HYDROMORPHONE HCL 2 MG/ML IJ SOLN: 2.0000 mg | INTRAMUSCULAR | Status: AC

## 2017-09-15 NOTE — ED Provider Notes (Cosign Needed)
[facility] DEPT Provider Note   CSN: [id number] Arrival date & time: 09/15/17  1442     History   Chief Complaint Chief Complaint  Patient presents with  . Sickle Cell Pain Crisis    HPI Wendy [name] is a 27 y.o. female with a PMHx of sickle cell anemia and prior tobacco use, who presents to the ED with complaints of sickle cell pain crisis since yesterday.  Patient states that her symptoms are similar to prior sickle cell pain crises, she started having diffuse bilateral leg, arm, and lower back pain yesterday which feels the same as her usual crises.  She describes her pain as 10/10 constant sharp nonradiating pain in the lower back, bilateral legs, and bilateral arms, worse with being cold, and unrelieved with Tylenol, Motrin, and Goody's powders.  Chart review reveals that she was seen in the ED yesterday for similar complaints, she told him at that time that she had run out of her Percocet; she had reassuring labs, was with her 6y/o son so there was concern about giving her IV doses of narcotics, therefore she was given 1 tab of home percocet + 30mg  IV toradol + 25mg  IV benadryl and discharged home.  She has 15 visits in the last 6 months for similar complaints, last admission was 08/18/17.  She states that today she ran out of her home oxygen, states that she is usually on 2 L via nasal cannula however her oxygen tank ran out, so she started feeling somewhat short of breath.  Now that she has been given 2 L of O2 via nasal cannula upon arrival, she feels much better and no longer feels short of breath.  She states that when she has sickle cell pain crises, she frequently gets short of breath, although chart review demonstrates that it doesn't look like she has complained of shortness of breath on many of her visits, she did c/o SOB during an admission in 07/19/17 when she had acute chest syndrome (at that time she was hypoxic; left AMA, came back 07/22/17 and was  readmitted); she's had a few visits where she reported SOB but there's no mention of her being on home O2.  She says she doesn't have any hx of asthma or any other pulmonary problem, but that she has O2 tanks at home because she's supposed to use O2 chronically; unclear why.  She is a former smoker who quit last year.  She denies recent elevation changes or air travel, denies sick contacts.  She states that she follows with the [facility] for her care, however yesterday she reported she hasn't yet seen them.    She denies fevers, chills, cough, URI symptoms, CP, LE swelling or ulcerations, recent travel/surgery/immobilization, estrogen use, personal/family hx of DVT/PE, abd pain, N/V/D/C, hematuria, dysuria, vaginal bleeding/discharge, numbness, tingling, focal weakness, or any other complaints at this time.   The history is provided by the patient and medical records. No language interpreter was used.  Sickle Cell Pain Crisis  Associated symptoms: shortness of breath (now feels better)   Associated symptoms: no chest pain, no cough, no fever, no nausea, no sore throat and no vomiting     Past Medical History:  Diagnosis Date  . Acute chest syndrome ([place]) 07/22/2017  . Anemia   . Sickle cell anemia (HCC)   . Tobacco abuse     Patient Active Problem List   Diagnosis Date Noted  . Acute chest syndrome ([place]) 07/22/2017  .  Sickle cell anemia with pain ([place]) 05/12/2017  . Sickle cell pain crisis ([place]) 05/27/2016  . Leukocytosis 05/27/2016  . Sickle cell anemia (HCC)     Past Surgical History:  Procedure Laterality Date  . CESAREAN SECTION    . CESAREAN SECTION WITH BILATERAL TUBAL LIGATION       OB History   None      Home Medications    Prior to Admission medications   Medication Sig Start Date End Date Taking? Authorizing Provider  acetaminophen (TYLENOL) 500 MG tablet Take 1,500 mg by mouth daily as needed (sever pain).    [provider]  etodolac (LODINE)  300 MG capsule Take 1 capsule (300 mg total) by mouth every 8 (eight) hours. 08/27/17   Dorie Rank, MD  folic acid (FOLVITE) 1 MG tablet Take 1 tablet (1 mg total) by mouth daily. 08/20/17 08/20/18  Dorena Dew, FNP  ibuprofen (ADVIL,MOTRIN) 200 MG tablet Take 600 mg by mouth daily as needed (severe pain).    [provider]  oxyCODONE-acetaminophen (PERCOCET/ROXICET) 5-325 MG tablet Take 1 tablet by mouth every 6 (six) hours as needed for severe pain. 09/03/17   Glyn Ade, PA-C    Family History Family History  Problem Relation Age of Onset  . Sickle cell trait Father     Social History Social History   Tobacco Use  . Smoking status: Never Smoker  . Smokeless tobacco: Never Used  Substance Use Topics  . Alcohol use: No  . Drug use: No     Allergies   Patient has no known allergies.   Review of Systems Review of Systems  Constitutional: Negative for chills and fever.  HENT: Negative for rhinorrhea and sore throat.   Respiratory: Positive for shortness of breath (now feels better). Negative for cough.   Cardiovascular: Negative for chest pain and leg swelling.  Gastrointestinal: Negative for abdominal pain, constipation, diarrhea, nausea and vomiting.  Genitourinary: Negative for dysuria, hematuria, vaginal bleeding and vaginal discharge.  Musculoskeletal: Positive for back pain and myalgias.  Skin: Negative for color change and rash.  Allergic/Immunologic: Positive for immunocompromised state (sickle cell).  Neurological: Negative for weakness and numbness.  Psychiatric/Behavioral: Negative for confusion.   All other systems reviewed and are negative for acute change except as noted in the HPI.    Physical Exam Updated Vital Signs BP 112/76   Pulse 87   Temp 98.1 F (36.7 C)   Resp 16   LMP 08/24/2017   SpO2 100%   Physical Exam  Constitutional: She is oriented to person, place, and time. Vital signs are normal. She appears well-developed and  well-nourished.  Non-toxic appearance. No distress.  Afebrile, nontoxic, NAD, texting on her cell phone during latter part of the evaluation.   HENT:  Head: Normocephalic and atraumatic.  Mouth/Throat: Oropharynx is clear and moist and mucous membranes are normal.  Eyes: Conjunctivae and EOM are normal. Right eye exhibits no discharge. Left eye exhibits no discharge.  Neck: Normal range of motion. Neck supple.  Cardiovascular: Normal rate, regular rhythm, normal heart sounds and intact distal pulses. Exam reveals no gallop and no friction rub.  No murmur heard. RRR, nl s1/s2, no m/r/g, distal pulses intact, no pedal edema   Pulmonary/Chest: Effort normal and breath sounds normal. No respiratory distress. She has no decreased breath sounds. She has no wheezes. She has no rhonchi. She has no rales.  CTAB in all lung fields, no w/r/r, no hypoxia or increased WOB, speaking in  full sentences, SpO2 100% on RA during entirety of evaluation (removed O2 during entire evaluation and SpO2 remained at 100% throughout the entire visit)  Abdominal: Soft. Normal appearance and bowel sounds are normal. She exhibits no distension. There is no tenderness. There is no rigidity, no rebound, no guarding, no CVA tenderness, no tenderness at McBurney's point and negative Murphy's sign.  Musculoskeletal: Normal range of motion.       Lumbar back: She exhibits tenderness. She exhibits normal range of motion, no bony tenderness and no spasm.  Lumbar spine with FROM intact without spinous process TTP, no bony stepoffs or deformities, with minimal diffuse b/l paraspinous muscle TTP without appreciable muscle spasms. No overlying skin changes.   MAE x4 Strength and sensation grossly intact in all extremities Distal pulses intact No pedal edema, neg homan's bilaterally   Neurological: She is alert and oriented to person, place, and time. She has normal strength. No sensory deficit.  Skin: Skin is warm, dry and intact. No  rash noted.  Psychiatric: She has a normal mood and affect.  Nursing note and vitals reviewed.    ED Treatments / Results  Labs (all labs ordered are listed, but only abnormal results are displayed) Labs Reviewed  CBC WITH DIFFERENTIAL/PLATELET - Abnormal; Notable for the following components:      Result Value   RBC 3.13 (*)    Hemoglobin 9.1 (*)    HCT 26.8 (*)    RDW 17.3 (*)    Platelets 525 (*)    All other components within normal limits  COMPREHENSIVE METABOLIC PANEL - Abnormal; Notable for the following components:   Total Protein 8.3 (*)    Albumin 5.2 (*)    Alkaline Phosphatase 29 (*)    Total Bilirubin 2.1 (*)    All other components within normal limits  RETICULOCYTES - Abnormal; Notable for the following components:   Retic Ct Pct 6.0 (*)    RBC. 3.13 (*)    Retic Count, Absolute 187.8 (*)    All other components within normal limits  LACTATE DEHYDROGENASE - Abnormal; Notable for the following components:   LDH 235 (*)    All other components within normal limits  URINALYSIS, ROUTINE W REFLEX MICROSCOPIC  I-STAT BETA HCG BLOOD, ED ([facility], [facility], [facility] ONLY)  I-STAT TROPONIN, ED    EKG EKG Interpretation  Date/Time:  Tuesday September 15 2017 16:11:51 EDT Ventricular Rate:  83 PR Interval:    QRS Duration: 94 QT Interval:  371 QTC Calculation: 436 R Axis:   81 Text Interpretation:  Normal sinus rhythm Normal ECG Confirmed by Pattricia Boss [phone]) on 09/15/2017 4:19:30 PM   Radiology No results found.  Procedures Procedures (including critical care time)  Medications Ordered in ED Medications  dextrose 5 %-0.45 % sodium chloride infusion ( Intravenous New Bag/Given 09/15/17 1542)  HYDROmorphone (DILAUDID) injection 2 mg (has no administration in time range)    Or  HYDROmorphone (DILAUDID) injection 2 mg (has no administration in time range)  HYDROmorphone (DILAUDID) injection 2 mg (has no administration in time range)    Or  HYDROmorphone (DILAUDID)  injection 2 mg (has no administration in time range)  diphenhydrAMINE (BENADRYL) capsule 25-50 mg (has no administration in time range)  ondansetron (ZOFRAN) injection 4 mg (has no administration in time range)  ketorolac (TORADOL) 30 MG/ML injection 30 mg (30 mg Intravenous Given 09/15/17 1542)  HYDROmorphone (DILAUDID) injection 2 mg (2 mg Intravenous Given 09/15/17 1543)    Or  HYDROmorphone (DILAUDID) injection  2 mg ( Subcutaneous See Alternative 09/15/17 1543)     Initial Impression / Assessment and Plan / ED Course  I have reviewed the triage vital signs and the nursing notes.  Pertinent labs & imaging results that were available during my care of the patient were reviewed by me and considered in my medical decision making (see chart for details).     27 y.o. female here with sickle cell pain crisis same as all prior sickle cell crises. Was seen yesterday for same, but was with her 6y/o son so given reassuring labs she was given home dose of percocet along with 30mg  IV toradol + 25mg  IV benadryl and then discharged. She states today she "ran out of her home oxygen" and so she felt SOB but now that she has 2L via [place] placed she feels better with regards to this. States she's supposed to be on O2 at home, unclear why, and I can't find documentation of this. On exam, I took her off O2 during entirety of exam and SpO2 remained at 100% throughout the entire evaluation; no tachycardia, no increased WOB, clear lung exam, no pedal edema, neg homan's sign, diffuse lumbar spine TTP but no focal midline spinal TTP, NVI in all extremities. She's been seen multiple times in the last few months for similar complaints (back and leg pain), only a few times has she reported SOB that I can tell, but she states this is common for her when she's in crisis. I highly doubt PE considering lack of tachycardia or hypoxia; doubt PNA or acute chest syndrome given lack of cough or hypoxia, etc. Will get labs, EKG, CXR, give  fluids and pain meds, then reassess shortly.   4:25 PM CBC w/diff with stable chronic anemia and thrombocytosis. CMP with mildly elevated Tbili 2.1 (similar to prior) and otherwise fairly unremarkable. BetaHCG neg. Retics similar to prior visits. LDH mildly elevated at 235. U/A pending. Trop neg. EKG unremarkable and nonischemic. CXR not done.  Pt stating she needs to leave due to family emergency, asking if I can fill something for her; [place] controlled substance database shows that she received 12 tablets of Percocet from the ED on 8/1, 60 tablets from her PCP on 07/18, 12 tablets from the ED on 7/2, 60 tablets from PCP on 6/24, and additional 3 other prescriptions from the ED since March 2019. Discussed that she needs to f/up with her regular doctor to get refills of chronic narcotics, these will not be given from the ED. Advised f/up with her PCP for ongoing management of her sickle cell pain, discussed use of OTC remedies for symptom control, adequate hydration/rest, and f/up with PCP in 2-3 days for recheck. I explained the diagnosis and have given explicit precautions to return to the ER including for any other new or worsening symptoms. The patient understands and accepts the medical plan as it's been dictated and I have answered their questions. Discharge instructions concerning home care and prescriptions have been given. The patient is STABLE and is discharged to home in good condition but leaving [facility].    Final Clinical Impressions(s) / ED Diagnoses   Final diagnoses:  Sickle cell pain crisis ([facility])  Other chronic pain  SOB (shortness of breath)  Thrombocytosis [facility])    ED Discharge Orders    [address] [place], [place] 09/15/17 1627

## 2017-09-15 NOTE — Discharge Instructions (Signed)
You are deciding to leave before your full evaluation is completed, which means you are leaving against medical advice. You understand the risks of leaving include worsening condition up to death. Use your home pain medications as needed for pain, but don't drive or operate machinery while taking narcotics; alternate between tylenol and motrin for additional pain relief. Stay well hydrated and get plenty of rest. Stay warm. Follow up with your regular doctor in 2-3 days for recheck of symptoms and ongoing management of your sickle cell disease. Return to the ER for emergent changes or worsening symptoms.

## 2017-09-15 NOTE — ED Notes (Signed)
Attempted IV access x 2, unsuccessful, another RN will attempt ultrasound access

## 2017-09-15 NOTE — ED Notes (Signed)
XR technician entered the room and patient immediately said "I have to leave, it is an emergency, it's concerning my child." Provider aware and stated she will talk to the patient. Will hold additional pain medication at this time.

## 2017-09-15 NOTE — ED Notes (Addendum)
CXR cancelled at this time due to patient refusal. Provider aware.

## 2017-09-15 NOTE — ED Triage Notes (Signed)
Transported by Wendy [name] from [facility], sickle cell crisis--pain to legs and back. Seen yesterday for same and had to leave due to family concerns. VSS with EMS.

## 2017-09-15 NOTE — ED Notes (Signed)
Bed: EF20 Expected date:  Expected time:  Means of arrival:  Comments: Sickle cell

## 2019-12-10 IMAGING — CT CT ANGIO CHEST
2 of 6 series · 18 of 36 positions shown · IV contrast (ISOVUE)
Comparison: 05/19/2017

CLINICAL DATA: Dyspnea and body pain x2 days. History of sickle
cell anemia and sickle cell crisis..

EXAM:
CT ANGIOGRAPHY CHEST WITH CONTRAST
TECHNIQUE: Multidetector CT imaging of the chest was performed using the
standard protocol during bolus administration of intravenous
contrast. Multiplanar CT image reconstructions and MIPs were
obtained to evaluate the vascular anatomy.
CONTRAST:  100mL [id number] IOPAMIDOL (MGZ775-YWB) INJECTION 76%

[Series 5: thins · axial · 0.52mm/px · z∈[-247,-24]mm · 17 of 249 slices shown]
[im 13/249  lung]
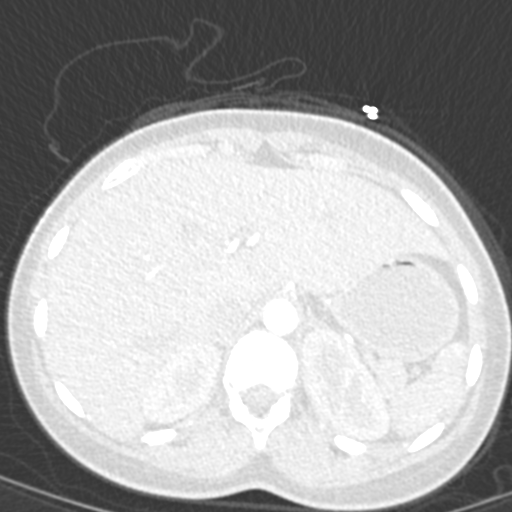
[im 25/249  mediastinal]
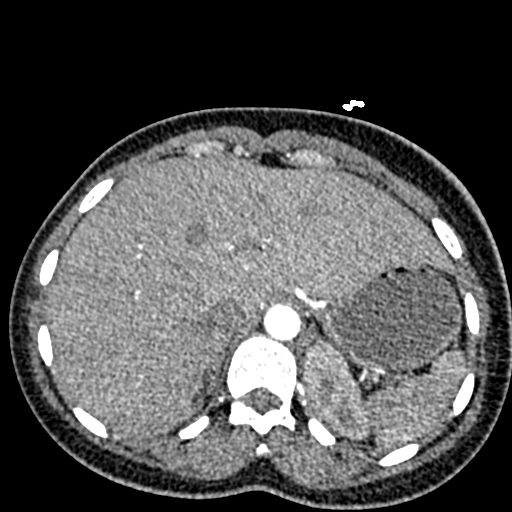
[im 38/249  lung]
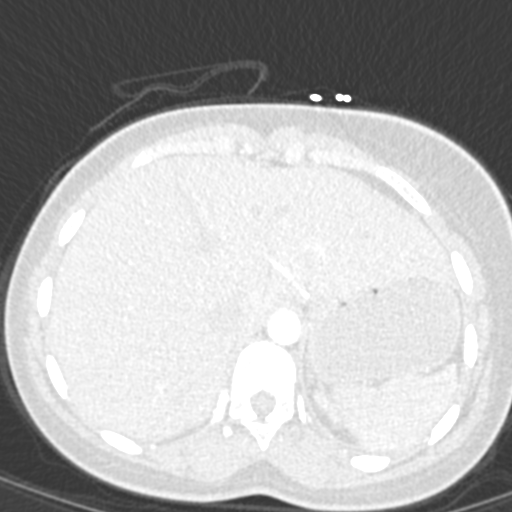
[im 50/249  mediastinal]
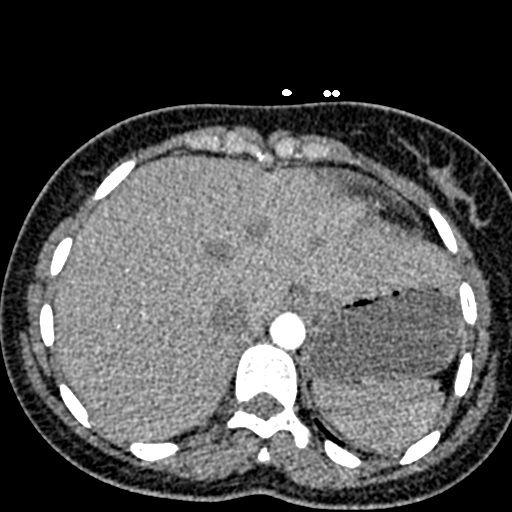
[im 75/249  lung]
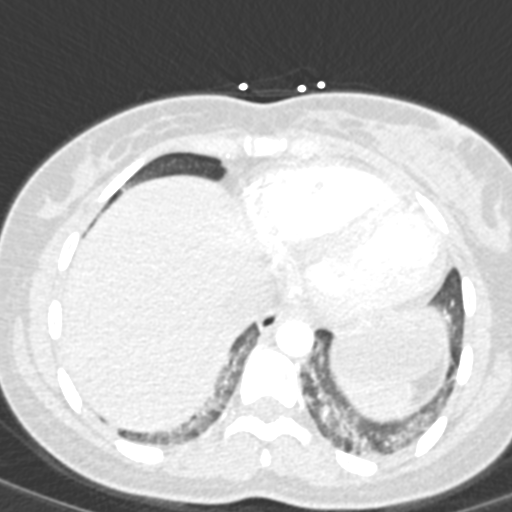
[im 87/249  mediastinal]
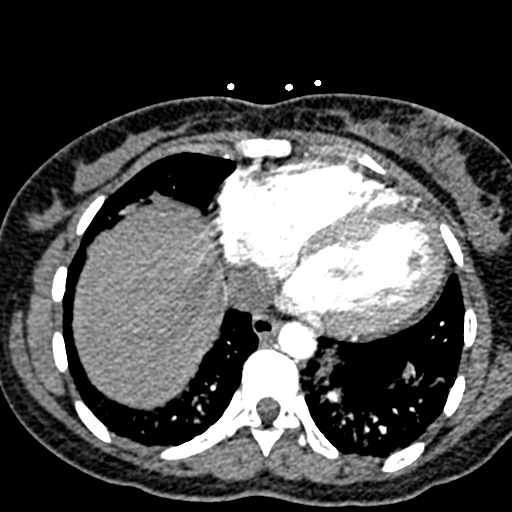
[im 100/249  lung]
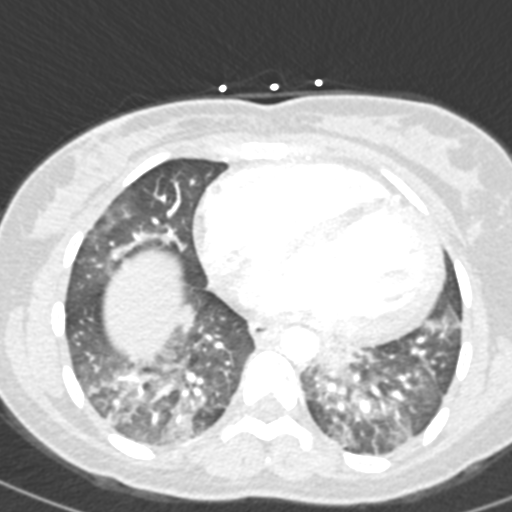
[im 112/249  mediastinal]
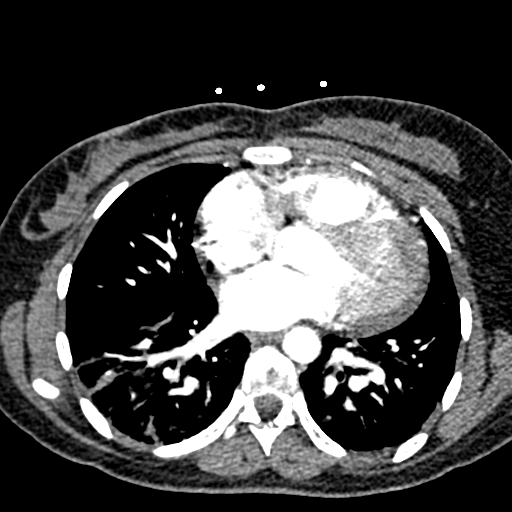
[im 125/249  lung]
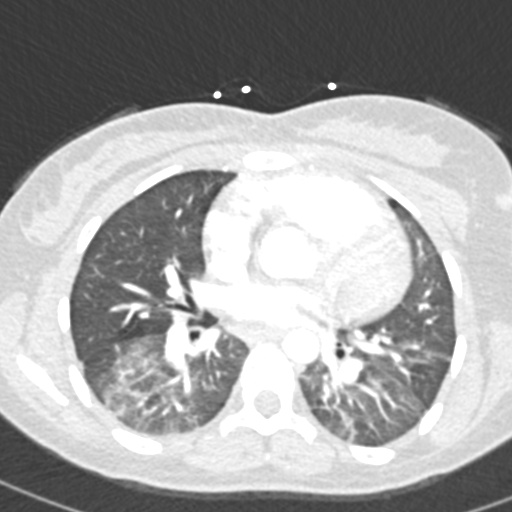
[im 137/249  mediastinal]
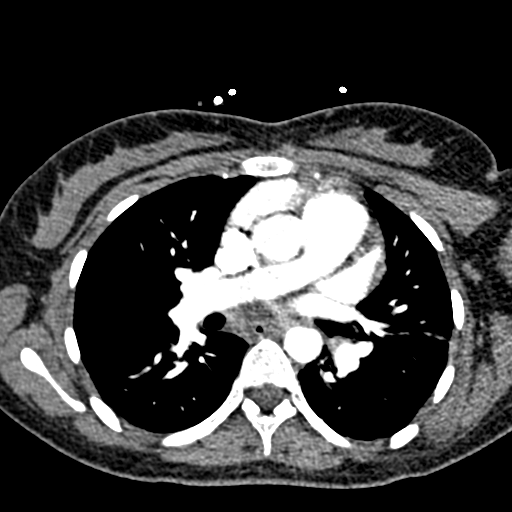
[im 149/249  lung]
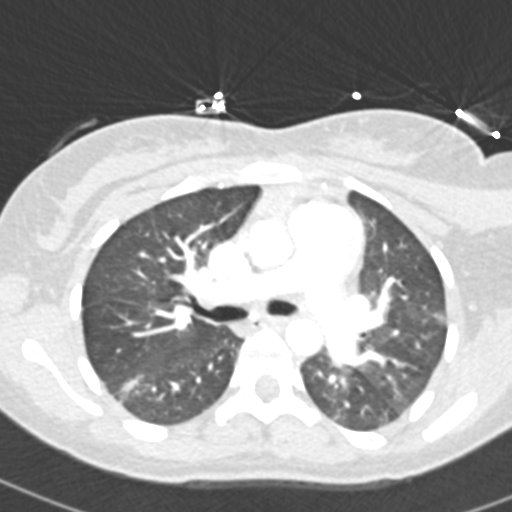
[im 162/249  mediastinal]
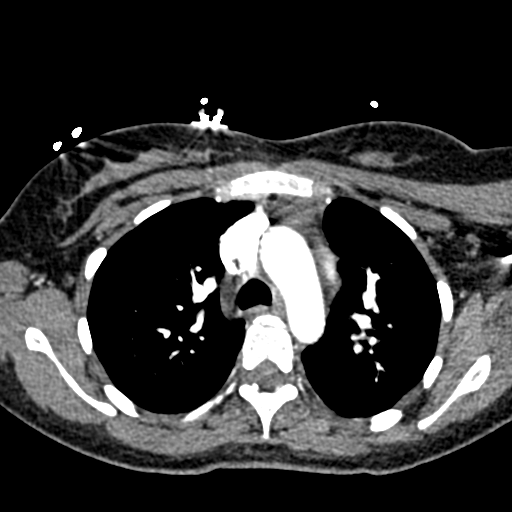
[im 174/249  lung]
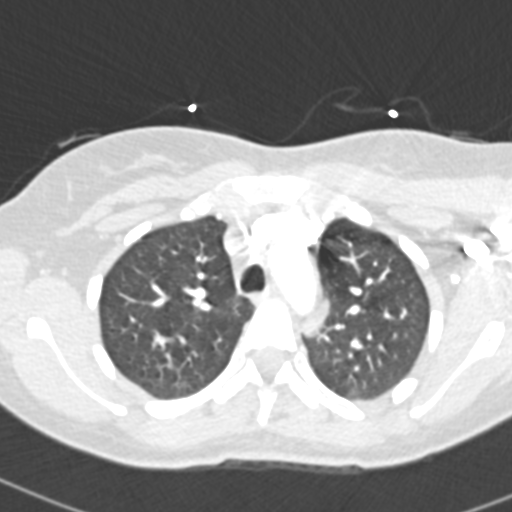
[im 199/249  mediastinal]
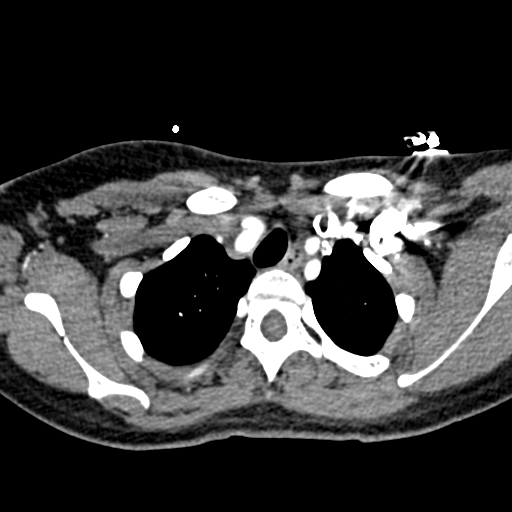
[im 211/249  lung]
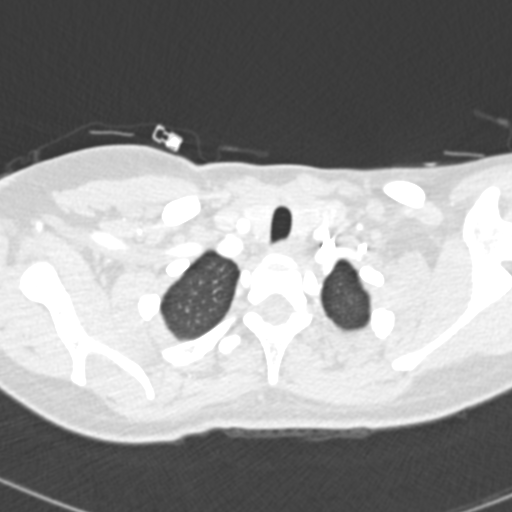
[im 224/249  mediastinal]
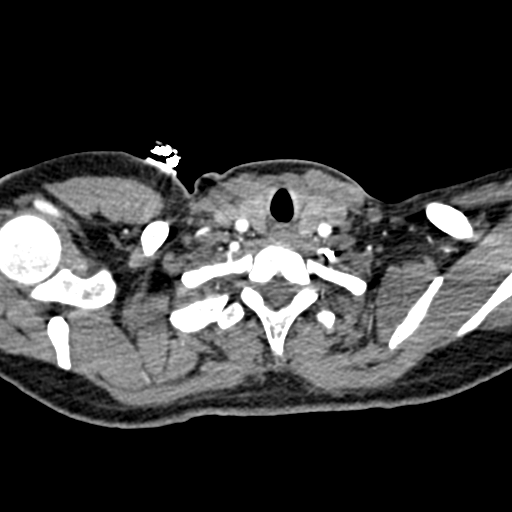
[im 236/249  lung]
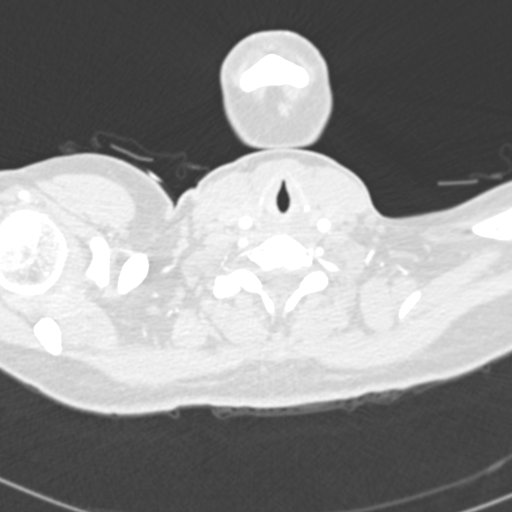

[Series 7: coronal mpr · coronal · 0.50mm/px · 1 of 116 slices shown]
[im 58/116  mediastinal]
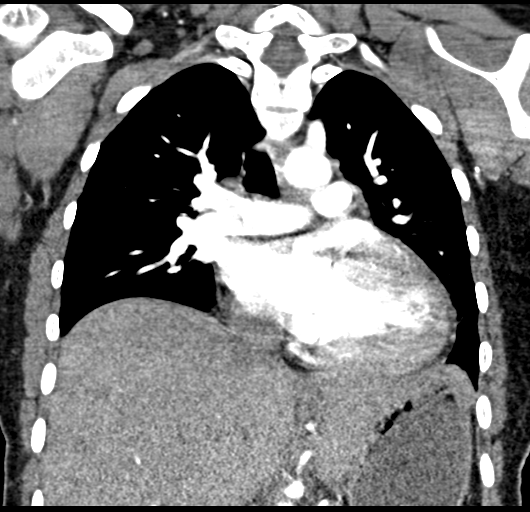

[18 of 36 positions shown; findings below may reference images not displayed]

FINDINGS: Cardiovascular: The study is of quality for the evaluation of
pulmonary embolism. There are no filling defects in the central,
lobar, segmental or subsegmental pulmonary artery branches to
suggest acute pulmonary embolism. Cardiomegaly is redemonstrated
without evidence of pericardial effusion. Motion related artifacts
limit assessment of the aortic root and ascending aorta. No
aneurysmal dilatation is identified. No definite evidence of
dissection given motion artifacts.

Mediastinum/Nodes: No discrete thyroid nodules. Unremarkable
esophagus. No pathologically enlarged axillary, mediastinal or hilar
lymph nodes.

Lungs/Pleura: No pneumothorax. No pleural effusion. Multilobar
airspace opacities most of which are ground-glass in appearance are
identified and more confluent in both lower lobes. Findings may
represent stigmata of acute chest syndrome given history of sickle
cell disease. Alternatively, multilobar pneumonia is a possibility
as well. No effusion or pneumothorax.

Upper abdomen: Unremarkable.

Musculoskeletal: No aggressive appearing focal osseous lesions. H
shaped thoracic vertebrae with central depression of the endplates
from osteonecrosis are identified of mid to lower thoracic
vertebrae, also in keeping with history of sickle cell disease.

Review of the MIP images confirms the above findings.
IMPRESSION: 1. Cardiomegaly with H-shaped thoracic vertebrae in keeping with
stigmata from patient's history of sickle cell disease.
2. Diffuse multilobar but lower lobe predominant ground-glass
opacities suspicious for acute chest syndrome. Alternatively,
multilobar pneumonia is a possibility.
3. No acute pulmonary embolus.

## 2019-12-23 IMAGING — DX DG CHEST 2V
2 series · 2 of 2 positions shown · non-contrast
Comparison: 07/27/2017.

CLINICAL DATA: Generalized body aches.  Shortness of breath.

EXAM:
CHEST - 2 VIEW

[w chest pa]
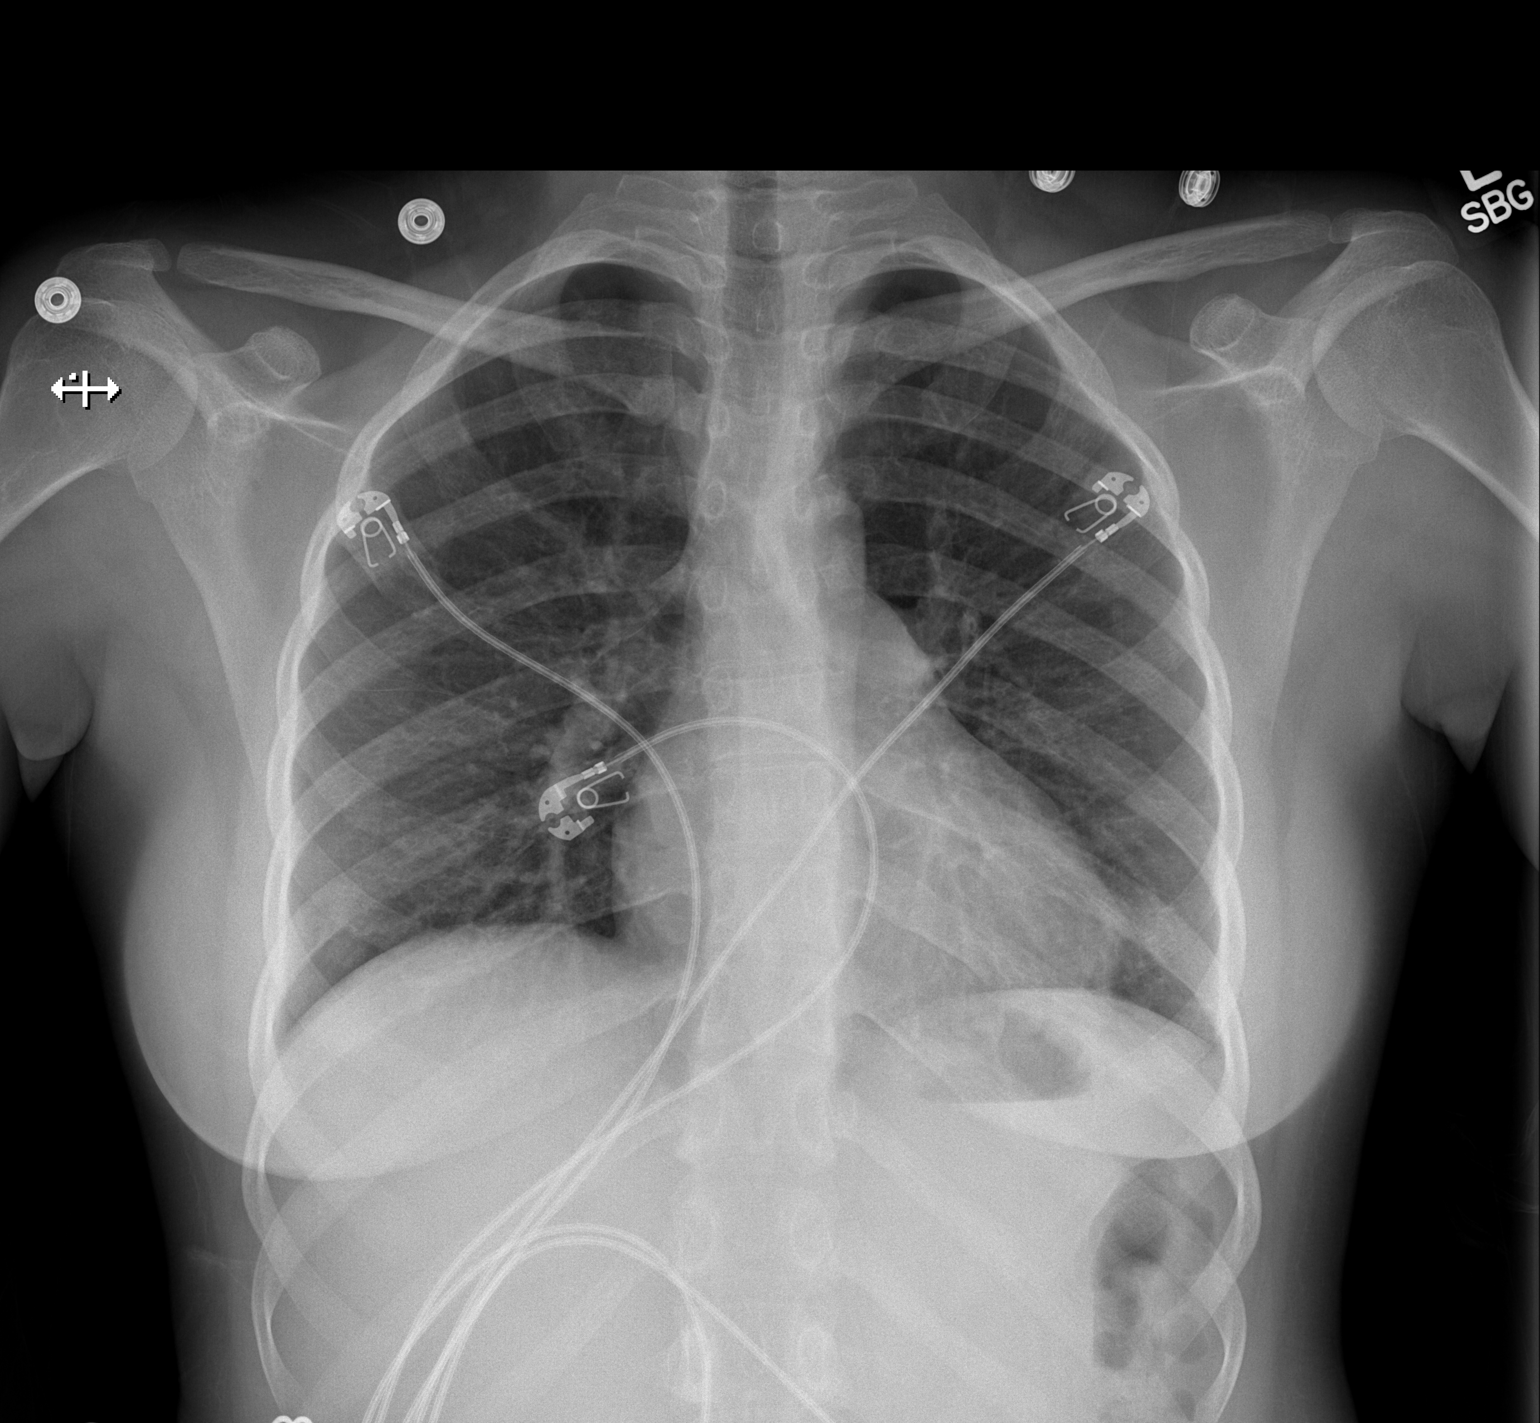

[w chest lat]
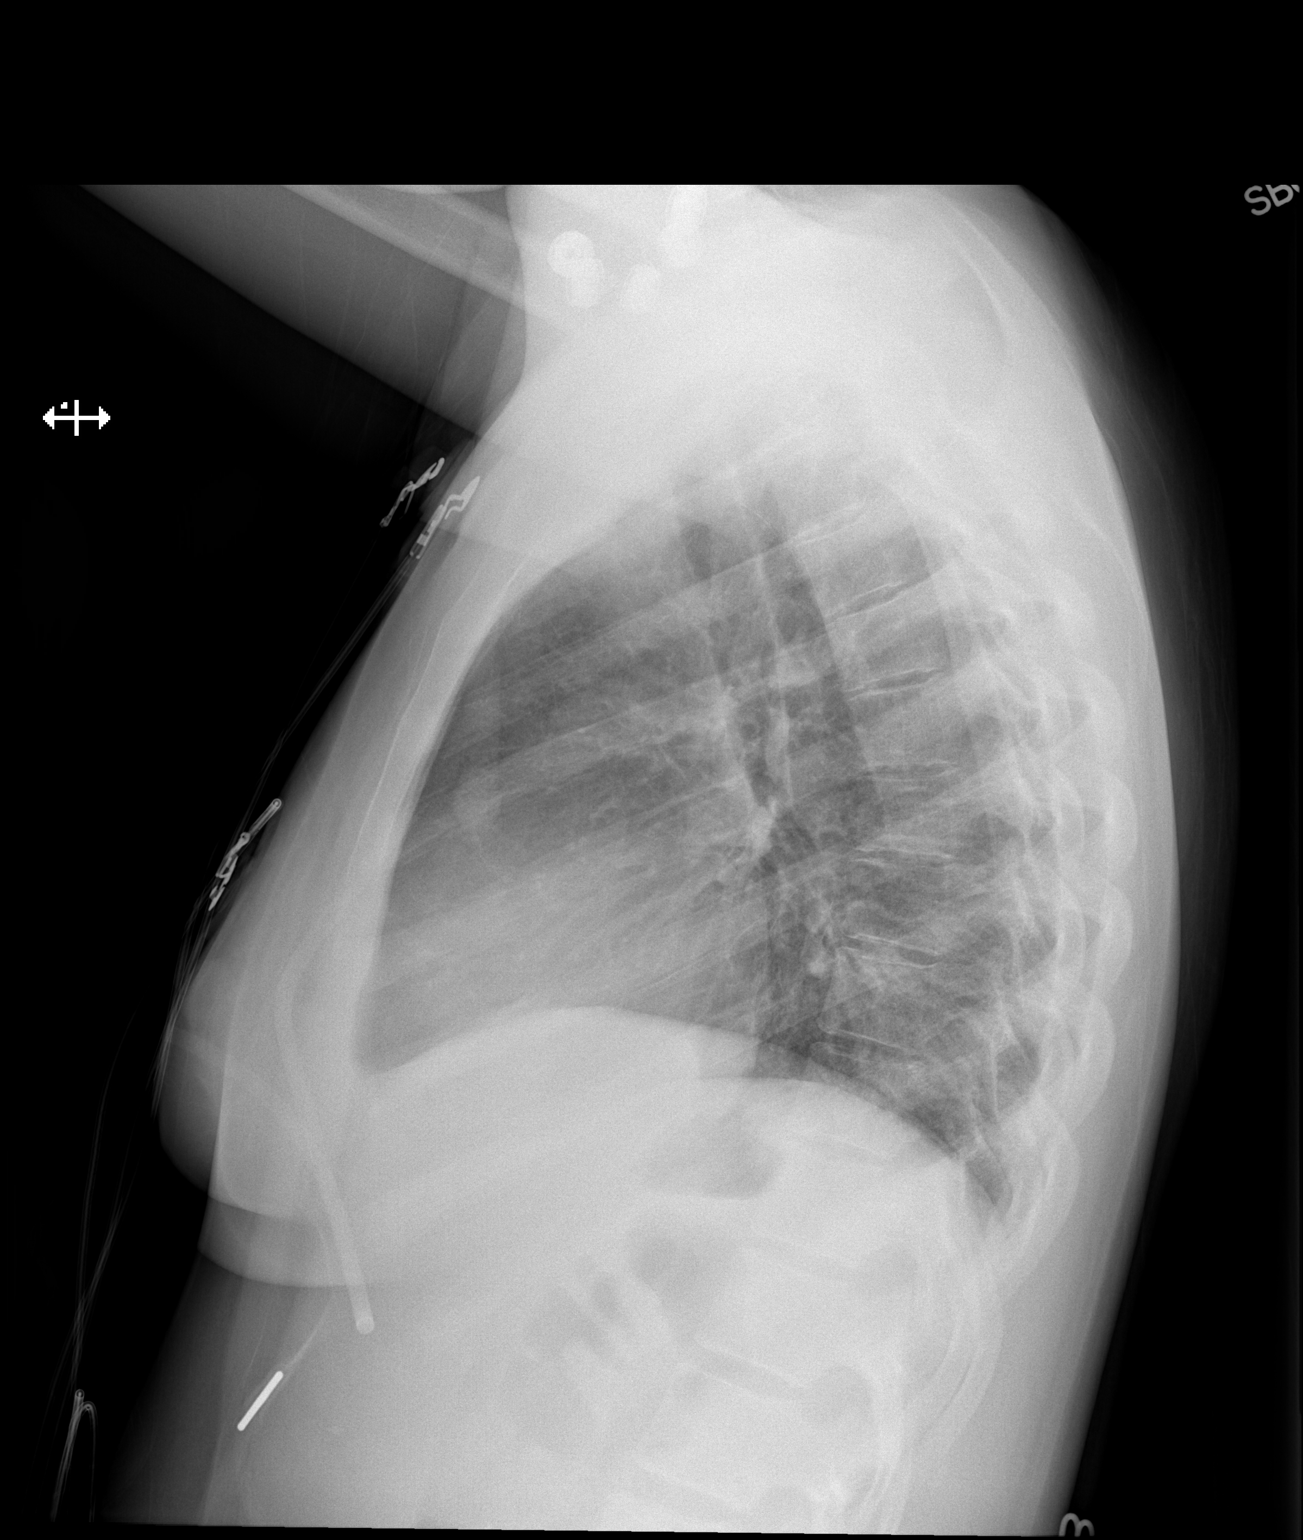

[2 of 2 positions shown; findings below may reference images not displayed]

FINDINGS: Mediastinum hilar structures normal. Interim near complete clearing
of previously identified basilar interstitial prominence. No pleural
effusion or pneumothorax. Thoracic spine scoliosis and degenerative
change.
IMPRESSION: Interim near complete clearing of previously identified basilar
interstitial prominence. No acute new infiltrates are noted.

## 2020-01-15 IMAGING — CR DG CHEST 2V
2 series · 2 of 2 positions shown · non-contrast
Comparison: 08/04/2017

CLINICAL DATA: Acute sickle cell pain concentrated in chest - COPD
- CHF - diabetic - asthma

EXAM:
CHEST - 2 VIEW

[w chest pa]
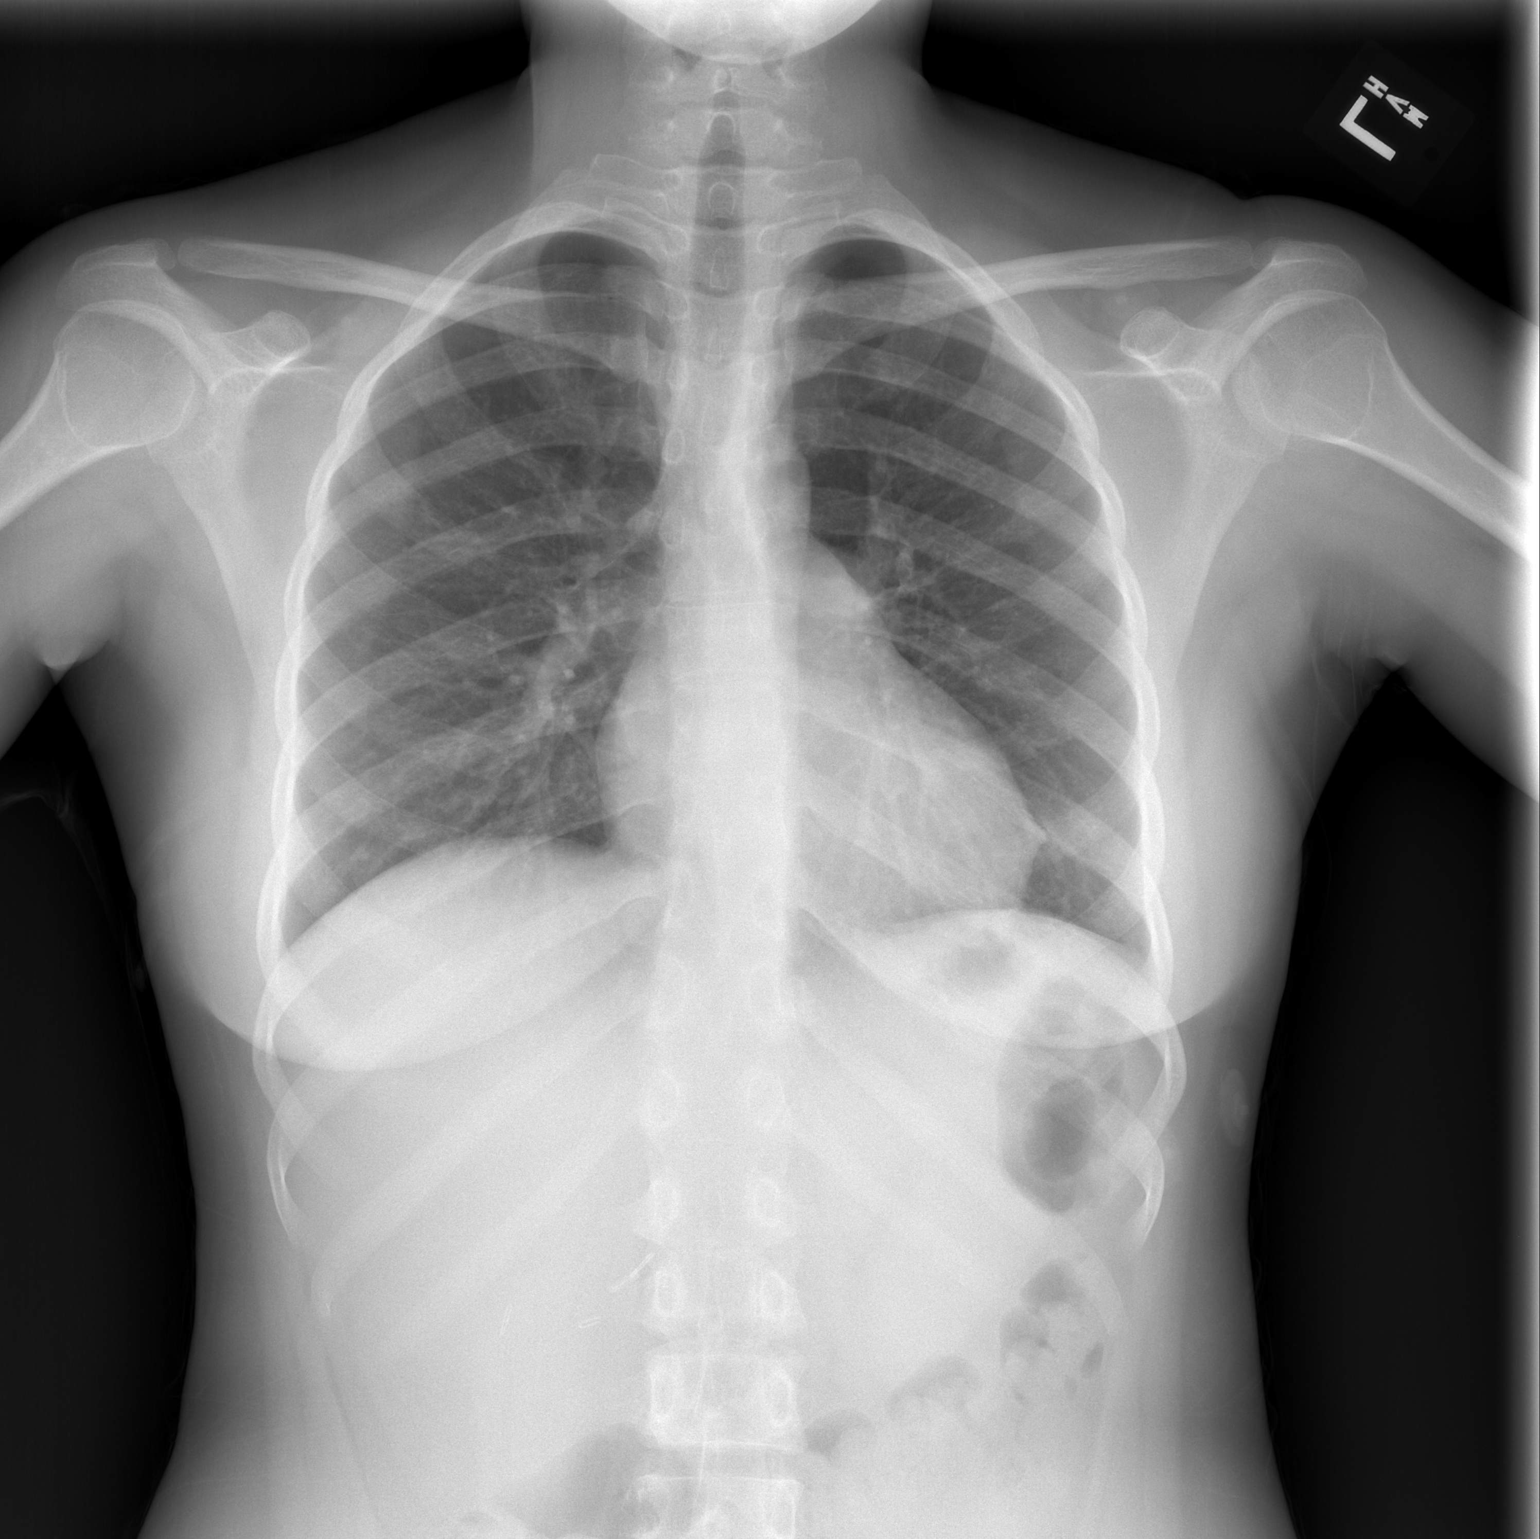

[w chest lat]
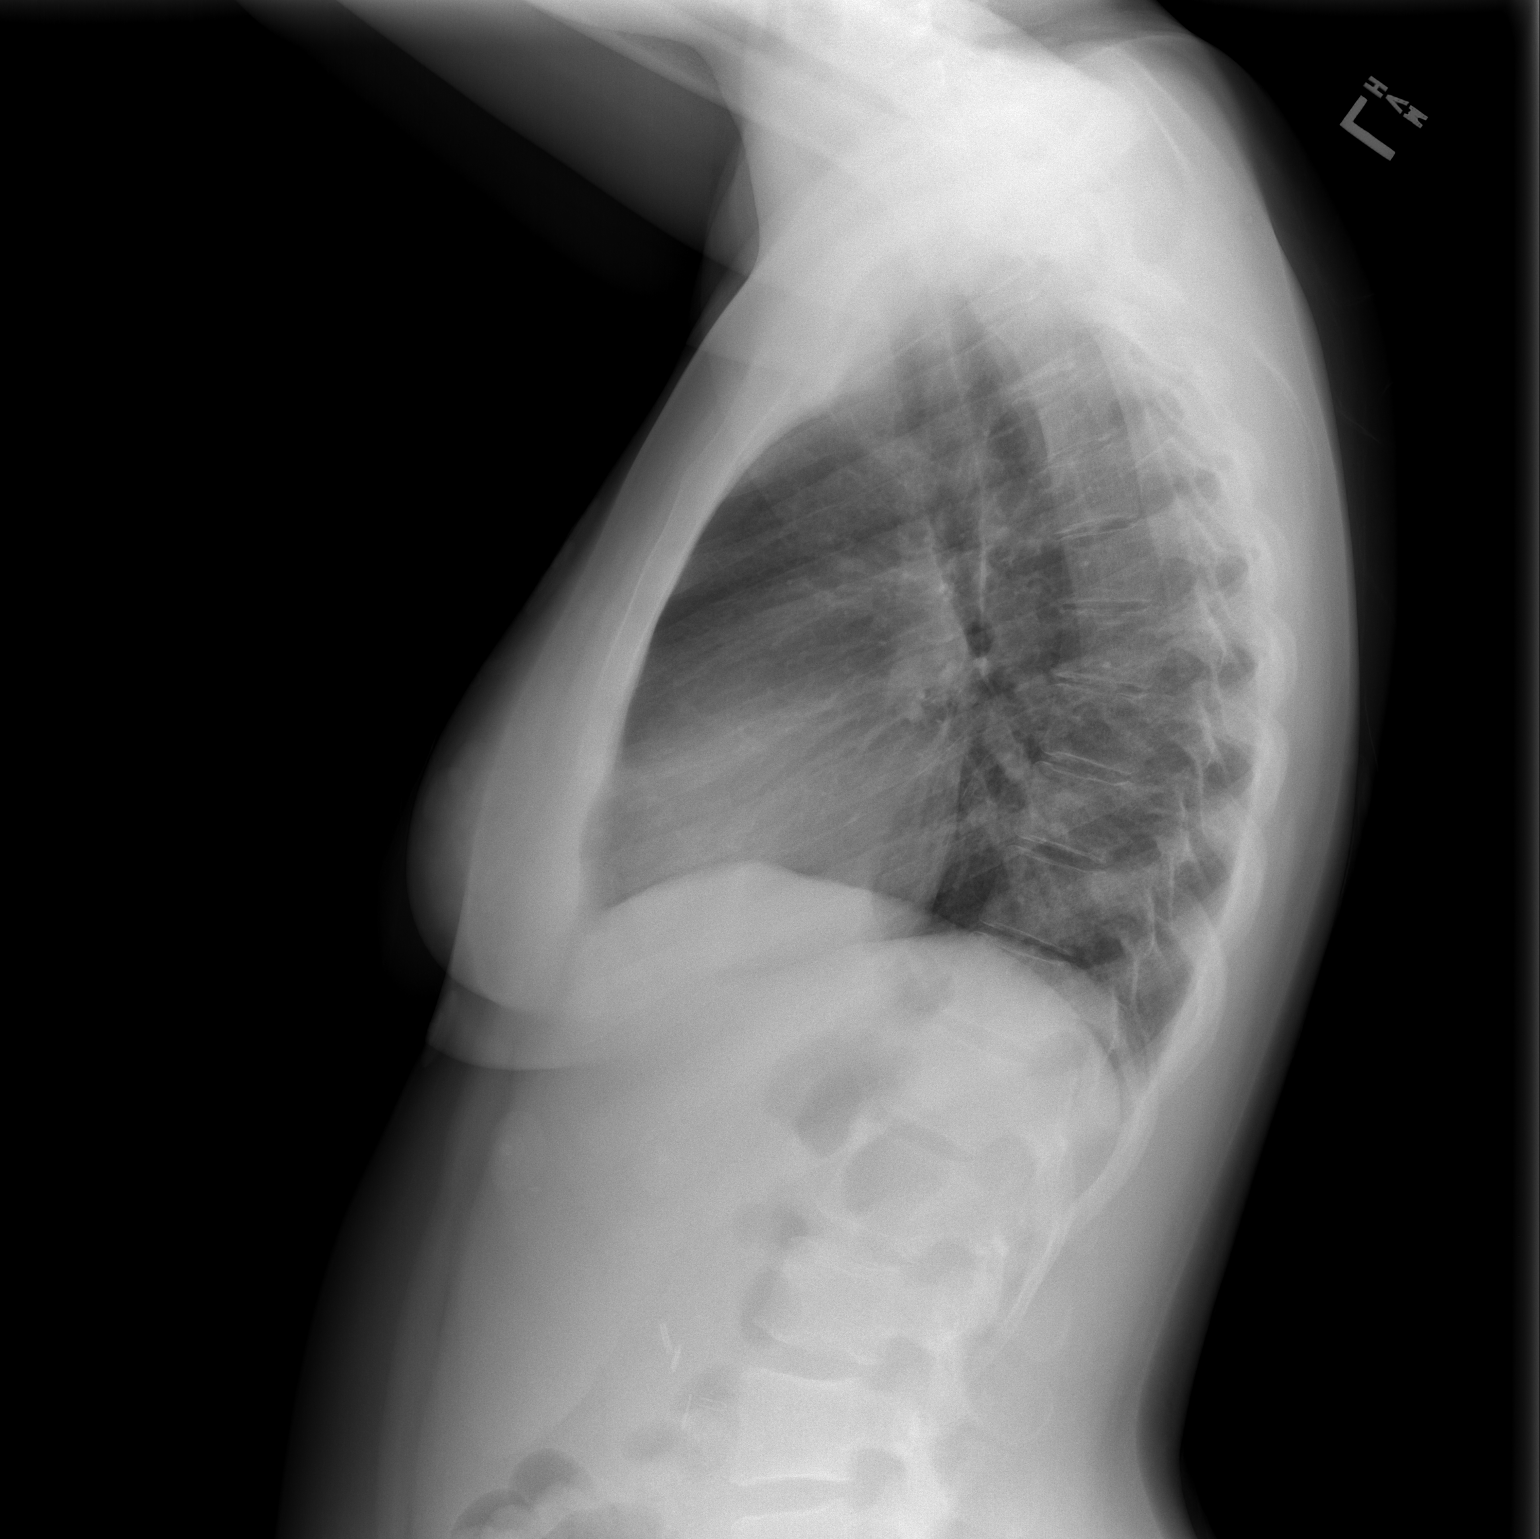

[2 of 2 positions shown; findings below may reference images not displayed]

FINDINGS: Lungs are clear.

Heart size and mediastinal contours are within normal limits.

No effusion.  No pneumothorax.

Visualized bones unremarkable.  Cholecystectomy clips.
IMPRESSION: No acute cardiopulmonary disease.

## 2020-03-08 ENCOUNTER — Ambulatory Visit: Payer: Medicaid Other | Admitting: Nurse Practitioner

## 2020-03-28 ENCOUNTER — Ambulatory Visit: Payer: Medicaid Other | Admitting: Nurse Practitioner
